# Patient Record
Sex: Male | Born: 1950
Health system: Southern US, Community
[De-identification: ages and names within clinical notes are randomized; demographics above are authoritative.]

## PROBLEM LIST (undated history)

## (undated) DIAGNOSIS — C801 Malignant (primary) neoplasm, unspecified: Secondary | ICD-10-CM

## (undated) DIAGNOSIS — G500 Trigeminal neuralgia: Secondary | ICD-10-CM

## (undated) DIAGNOSIS — E119 Type 2 diabetes mellitus without complications: Secondary | ICD-10-CM

## (undated) DIAGNOSIS — T1490XA Injury, unspecified, initial encounter: Secondary | ICD-10-CM

## (undated) DIAGNOSIS — M5126 Other intervertebral disc displacement, lumbar region: Secondary | ICD-10-CM

## (undated) DIAGNOSIS — R200 Anesthesia of skin: Secondary | ICD-10-CM

## (undated) DIAGNOSIS — I10 Essential (primary) hypertension: Secondary | ICD-10-CM

## (undated) DIAGNOSIS — E785 Hyperlipidemia, unspecified: Secondary | ICD-10-CM

## (undated) DIAGNOSIS — R202 Paresthesia of skin: Secondary | ICD-10-CM

## (undated) HISTORY — DX: Other intervertebral disc displacement, lumbar region: M51.26

## (undated) HISTORY — PX: TRIGEMINAL NERVE DECOMPRESSION: SHX2579

## (undated) HISTORY — DX: Essential (primary) hypertension: I10

## (undated) HISTORY — DX: Hyperlipidemia, unspecified: E78.5

## (undated) HISTORY — DX: Trigeminal neuralgia: G50.0

## (undated) HISTORY — DX: Type 2 diabetes mellitus without complications: E11.9

---

## 2003-10-22 ENCOUNTER — Ambulatory Visit (HOSPITAL_COMMUNITY): Admission: RE | Admit: 2003-10-22 | Discharge: 2003-10-22 | Payer: Self-pay | Admitting: Gastroenterology

## 2004-05-07 HISTORY — PX: KNEE ARTHROSCOPY: SUR90

## 2007-08-26 ENCOUNTER — Encounter: Admission: RE | Admit: 2007-08-26 | Discharge: 2007-08-26 | Payer: Self-pay | Admitting: Sports Medicine

## 2010-09-22 NOTE — Op Note (Signed)
NAMEORVILL, COULTHARD                          ACCOUNT NO.:  [id number]   MEDICAL RECORD NO.:  [id number]                   PATIENT TYPE:  AMB   LOCATION:  ENDO                                 FACILITY:  [facility]   PHYSICIAN:  Anselmo Rod, M.D.               DATE OF BIRTH:  04-18-1951   DATE OF PROCEDURE:  10/22/2003  DATE OF DISCHARGE:                                 OPERATIVE REPORT   PROCEDURE PERFORMED:  Screening colonoscopy.   ENDOSCOPIST:  Anselmo Rod, M.D.   INSTRUMENT USED:  Olympus video colonoscope.   INDICATION FOR PROCEDURE:  A 60 year old white male undergoing screening  colonoscopy.  Rule out colonic polyps, masses, etc.   PREPROCEDURE PREPARATION:  Informed consent was procured from the patient.  The patient was fasted for eight hours prior to the procedure and prepped  with a bottle of magnesium citrate and a gallon of GoLYTELY the night prior  to the procedure.   PREPROCEDURE PHYSICAL:  VITAL SIGNS:  The patient had stable vital signs.  NECK:  Supple.  CHEST:  Clear to auscultation.  S1, S2 regular.  ABDOMEN:  Soft with normal bowel sounds.   DESCRIPTION OF PROCEDURE:  The patient was placed in the left lateral  decubitus position and sedated with 60 mg of Demerol and 6 mg of Versed  intravenously in slow increments.  Once the patient was adequately sedate  and maintained on low-flow oxygen and continuous cardiac monitoring, the  Olympus video colonoscope was advanced from the rectum to the cecum.  Except  for small internal hemorrhoids, no other abnormalities were noted.  No  masses, polyps, erosions, ulcerations, or diverticula were seen.  The  patient tolerated the procedure well without immediate complications.   IMPRESSION:  Normal colonoscopy except for small internal hemorrhoids, no  masses, polyps, or diverticula seen.   RECOMMENDATIONS:  1. Continue a high-fiber diet.  2. Repeat colonoscopy in the next 10 years unless the patient develops  any     abnormal symptoms in the interim.  3. Outpatient follow-up as the need arises in the future.                                               Anselmo Rod, M.D.    JNM/MEDQ  D:  10/22/2003  T:  10/23/2003  Job:  [id number]   cc:   Marjory Lies, M.D.  [id number]  [place]  [place] [address]  Fax: [phone]

## 2011-01-27 ENCOUNTER — Emergency Department (HOSPITAL_COMMUNITY): Payer: 59

## 2011-01-27 ENCOUNTER — Inpatient Hospital Stay (HOSPITAL_COMMUNITY)
Admission: EM | Admit: 2011-01-27 | Discharge: 2011-01-28 | DRG: 505 | Disposition: A | Discharge status: Home / Self Care | Payer: 59 | Attending: Orthopedic Surgery | Admitting: Orthopedic Surgery

## 2011-01-27 DIAGNOSIS — S92919B Unspecified fracture of unspecified toe(s), initial encounter for open fracture: Principal | ICD-10-CM | POA: Diagnosis present

## 2011-01-27 DIAGNOSIS — W28XXXA Contact with powered lawn mower, initial encounter: Secondary | ICD-10-CM | POA: Diagnosis present

## 2011-01-27 DIAGNOSIS — M109 Gout, unspecified: Secondary | ICD-10-CM | POA: Diagnosis present

## 2011-01-27 DIAGNOSIS — I10 Essential (primary) hypertension: Secondary | ICD-10-CM | POA: Diagnosis present

## 2011-01-27 LAB — POCT I-STAT, CHEM 8
Calcium, Ion: 1.18 mmol/L (ref 1.12–1.32)
Chloride: 105 mEq/L (ref 96–112)
Glucose, Bld: 113 mg/dL — ABNORMAL HIGH (ref 70–99)
HCT: 43 % (ref 39.0–52.0)

## 2011-01-27 LAB — DIFFERENTIAL
Basophils Relative: 0 % (ref 0–1)
Eosinophils Absolute: 0.1 10*3/uL (ref 0.0–0.7)
Monocytes Absolute: 0.6 10*3/uL (ref 0.1–1.0)
Monocytes Relative: 7 % (ref 3–12)
Neutro Abs: 5.4 10*3/uL (ref 1.7–7.7)

## 2011-01-27 LAB — CBC
Hemoglobin: 13.8 g/dL (ref 13.0–17.0)
MCH: 30.2 pg (ref 26.0–34.0)
MCHC: 34.3 g/dL (ref 30.0–36.0)

## 2011-01-27 LAB — POCT I-STAT TROPONIN I: Troponin i, poc: 0.03 ng/mL (ref 0.00–0.08)

## 2011-01-28 LAB — BASIC METABOLIC PANEL
BUN: 15 mg/dL (ref 6–23)
Calcium: 8.5 mg/dL (ref 8.4–10.5)
Creatinine, Ser: 0.88 mg/dL (ref 0.50–1.35)
GFR calc Af Amer: >60 mL/min (ref >60)
GFR calc non Af Amer: >60 mL/min (ref >60)
Potassium: 3.8 mEq/L (ref 3.5–5.1)

## 2011-01-28 LAB — CBC
HCT: 37.2 % — ABNORMAL LOW (ref 39.0–52.0)
MCH: 30 pg (ref 26.0–34.0)
MCHC: 33.9 g/dL (ref 30.0–36.0)
MCV: 88.6 fL (ref 78.0–100.0)
RDW: 13.1 % (ref 11.5–15.5)

## 2011-02-11 NOTE — Op Note (Signed)
  NAMEJEREK, Jerry Hudson NO.:  [id number]  MEDICAL RECORD NO.:  [id number]  LOCATION:  1604                         FACILITY:  [facility]  PHYSICIAN:  Mila Homer. Sherlean Foot, M.D. DATE OF BIRTH:  [birth year]  DATE OF PROCEDURE: DATE OF DISCHARGE:                              OPERATIVE REPORT   SURGEON:  Mila Homer. Sherlean Foot, M.D.  ASSISTANT:  Altamese Cabal, PA-C  ANESTHESIA:  General.  PREOPERATIVE DIAGNOSES:  Right elbow fracture and laceration of a right great toe.  POSTOPERATIVE DIAGNOSES:  Right elbow fracture and laceration of a right great toe.  PROCEDURE:  Right elbow irrigation and debridement and percutaneous fixation of the right great toe.  INDICATIONS FOR PROCEDURE:  The patient is a 60 year old status post long arm injury today for open fracture of the distal phalanx.  Consent obtained for irrigation and debridement and fracture fixation.  DESCRIPTION OF PROCEDURE:  The patient was laid supine, administered general anesthesia.  The right toe was prepped and draped in usual sterile fashion.  I then irrigated with 3000 mL of LR, through the post lavage system.  There was really no over debris around the toe.  The distal phalanx of fractured in a horizontal plane which is rather bizarre.  He has is very fungally infected toe nail.  I cleaned this as well as total debris monitored around the edges of the skin and then started reducing the fracture anatomically checking under AP and lateral C-arm images.  I then closed the incision with 2-0 nylon sutures in a horizontal mattress fashion.  I then dressed with Xeroform dressing sponges, full back pullers between the toes and dressed with Kerlix dressing with the point.  Wrapped on top of that with a 4-inch Coban on top of that, again the tourniquet was used.  My concern with this is that I think additional arteries on both sides of the toe probably compromised while there was fairly reasonable 2-point capillary  refill. Again the procedure was slow and was then possible to capillary refill in nail bed due to the fugal infected nail.  I almost removed that nail; however, I felt it may cause him much of a dorsal flap, so that I could keep there.  The patient was taken to recovery room in stable condition.          ______________________________ Mila Homer Sherlean Foot, M.D.     SDL/MEDQ  D:  01/27/2011  T:  01/28/2011  Job:  [id number]  Electronically Signed by Georgena Spurling M.D. on 02/11/2011 11:22:05 AM

## 2011-03-06 NOTE — Discharge Summary (Signed)
  NAMEJAHZIER, VILLALON NO.:  [id number]  MEDICAL RECORD NO.:  [id number]  LOCATION:  1604                         FACILITY:  [facility]  PHYSICIAN:  Mila Homer. Sherlean Foot, M.D. DATE OF BIRTH:  01/22/1951  DATE OF ADMISSION:  01/27/2011 DATE OF DISCHARGE:  01/29/2011                              DISCHARGE SUMMARY   ADMISSION DIAGNOSIS:  Right elbow fracture and laceration of the right great toe.  DISCHARGE DIAGNOSIS:  Right elbow fracture and laceration of the right great toe.  PROCEDURE:  A right elbow irrigation and debridement, percutaneous fixation of the right great toe.  HISTORY:  The patient is a 60 year old, status post long arm injury today for open fracture of the distal phalanx.  Consent obtained, irrigated and debrided fracture fixation.  This patient has no known drug allergies.  Upon admission, the patient was taking allopurinol 4 cap daily.  HOSPITAL COURSE:  This is a 60 year old male, comes in for irrigation and debridement of the right great toe status post irrigation and fixation.  The patient was doing well, was pain controlled, vital signs were stable.  Blood levels were stable as well.  Electrolytes were intact.  The wound was dressed and patient was neurovascularly intact, had good sensation and total fixation pin was in place.  The patient was discharged and was continued to take antibiotics at home.  Discharge home today and  will follow up with [place] on February 01, 2011, with Dr. Sherlean Foot.  The patient was given 2 doses of IV Ancef upon leaving hospital, on postop day 2.  The patient will follow with Dr. Sherlean Foot on  February 01, 2011, call for appointment,  [phone], discharged in improved condition.    ______________________________ Altamese Cabal, PA-C   ______________________________ Mila Homer. Sherlean Foot, M.D.    MJ/MEDQ  D:  02/28/2011  T:  02/28/2011  Job:  [id number]  Electronically Signed by Georgena Spurling M.D. on 03/06/2011 05:26:26  PM

## 2013-02-18 ENCOUNTER — Other Ambulatory Visit: Payer: Self-pay | Admitting: Family Medicine

## 2013-02-18 DIAGNOSIS — H538 Other visual disturbances: Secondary | ICD-10-CM

## 2013-02-18 DIAGNOSIS — G510 Bell's palsy: Secondary | ICD-10-CM

## 2013-02-18 DIAGNOSIS — R519 Headache, unspecified: Secondary | ICD-10-CM

## 2013-02-19 ENCOUNTER — Ambulatory Visit
Admission: RE | Admit: 2013-02-19 | Discharge: 2013-02-19 | Disposition: A | Discharge status: Home / Self Care | Payer: 59 | Source: Ambulatory Visit | Attending: Family Medicine | Admitting: Family Medicine

## 2013-02-19 DIAGNOSIS — G510 Bell's palsy: Secondary | ICD-10-CM

## 2013-02-19 DIAGNOSIS — H538 Other visual disturbances: Secondary | ICD-10-CM

## 2013-02-19 DIAGNOSIS — R519 Headache, unspecified: Secondary | ICD-10-CM

## 2013-03-16 ENCOUNTER — Ambulatory Visit (INDEPENDENT_AMBULATORY_CARE_PROVIDER_SITE_OTHER): Payer: 59 | Admitting: Neurology

## 2013-03-16 ENCOUNTER — Encounter: Payer: Self-pay | Admitting: Neurology

## 2013-03-16 VITALS — BP 126/84 | HR 78 | Temp 98.0°F | Ht 73.0 in | Wt 269.8 lb

## 2013-03-16 DIAGNOSIS — G500 Trigeminal neuralgia: Secondary | ICD-10-CM

## 2013-03-16 NOTE — Progress Notes (Signed)
[facility] Clinic Note - Initial Visit   Date: 03/16/2013    Jerry Hudson MRN: [id number] DOB: 05/11/1950   Dear Dr Arlyce Dice:  Thank you for your kind referral of Jerry Hudson for consultation of facial pain. Although his history is well known to you, please allow [place] to reiterate it for the purpose of our medical record. The patient was accompanied to the clinic by self.   History of Present Illness: Jerry Hudson is a 62 y.o. year-old right-handed Caucasian male with history of hypertension presenting for evaluation of facial pain.   In September 2014, he developed right sided facial pain over the right cheek radiation into the nose and eye. Pain was described as a constant tingling.  No associated numbness or weakness.  Symptoms were not exacerbated by chewing, talking, or cold air. Light pressure over the right cheek would occasionally worsen his pain.  He saw his eye doctor who did not find any abnormalities.  He then saw his PCP, Dr. Arlyce Dice, in September 2014, who started him on prednisone.  About one week later, symptoms persisted so he had a MRI brain (see below).  Over past week, his symptoms have completely resolved.  Out-side paper records, electronic medical record, and images have been reviewed where available and summarized as:  MRI brain wo contrast 02/19/2013: Scattered small white matter hyperintensities bilaterally. These have a chronic appearance and may be due to chronic microvascular ischemia or migraine headache.  No acute abnormality.    Past Medical History  Diagnosis Date  . Hypertension     Past Surgical History  Procedure Laterality Date  . Knee arthroscopy  2006    both knees     Medications:  Amlodipine 5mg  daily Aspirin 325 mg daily  Allergies: No Known Allergies  Family History: Family History  Problem Relation Age of Onset  . Cancer Mother   . Cancer Father   . Diabetes Sister     Social History: History    Social History  . Marital Status: Married    Spouse Name: N/A    Number of Children: N/A  . Years of Education: N/A   Occupational History  . Not on file.   Social History Main Topics  . Smoking status: Never Smoker   . Smokeless tobacco: Never Used  . Alcohol Use: Yes  . Drug Use: No  . Sexual Activity: Not on file   Other Topics Concern  . Not on file   Social History Narrative  . No narrative on file    Review of Systems:  CONSTITUTIONAL: No fevers, chills, night sweats, or weight loss.   EYES: No visual changes or eye pain ENT: No hearing changes.  No history of nose bleeds.   RESPIRATORY: No cough, wheezing and shortness of breath.   CARDIOVASCULAR: Negative for chest pain, and palpitations.   GI: Negative for abdominal discomfort, blood in stools or black stools.  No recent change in bowel habits.   GU:  No history of incontinence.   MUSCLOSKELETAL: No history of joint pain or swelling.  No myalgias.   SKIN: Negative for lesions, rash, and itching.   HEMATOLOGY/ONCOLOGY: Negative for prolonged bleeding, bruising easily, and swollen nodes.  No history of cancer.   ENDOCRINE: Negative for cold or heat intolerance, polydipsia or goiter.   PSYCH:  No depression or anxiety symptoms.   NEURO: As Above.   Vital Signs:  BP 126/84  Pulse 78  Temp(Src) 98 F (36.7 C) (  Oral)  Ht 6\' 1"  (1.854 m)  Wt 269 lb 12.8 oz (122.38 kg)  BMI 35.60 kg/m2   Neurological Exam: MENTAL STATUS including orientation to time, place, person, recent and remote memory, attention span and concentration, language, and fund of knowledge is normal.  Speech is not dysarthric.  CRANIAL NERVES: II:  No visual field defects.  Unremarkable fundi.   III-IV-VI: Pupils equal round and reactive to light.  Normal conjugate, extra-ocular eye movements in all directions of gaze.  No nystagmus.  No ptosis.   V:  Normal facial sensation.     VII:  Normal facial symmetry and movements.  VIII:  Normal  hearing and vestibular function.   IX-X:  Normal palatal movement.   XI:  Normal shoulder shrug and head rotation.   XII:  Normal tongue strength and range of motion, no deviation or fasciculation.  MOTOR:  Motor strength is 5/5 in all extremities.  No atrophy, fasciculations or abnormal movements.  No pronator drift.  Tone is normal.    MSRs:  Right                                                                 Left brachioradialis 2+  brachioradialis 2+  biceps 2+  biceps 2+  triceps 2+  triceps 2+  patellar 2+  patellar 2+  ankle jerk 1+  ankle jerk 1+  Hoffman no  Hoffman no  plantar response down  plantar response down   SENSORY:  Reduced vibration at great toe bilaterally (age-related loss), otherwise normal and symmetric perception of light touch, pinprick, and proprioception.  Romberg's sign absent.   COORDINATION/GAIT: Normal finger-to- nose-finger and heel-to-shin.  Intact rapid alternating movements bilaterally.  Able to rise from a chair without using arms.  Gait narrow based and stable. Tandem and stressed gait intact.    IMPRESSION/PLAN: Mr. Klingel is a 62 year-old gentleman presenting for evaluation of right facial pain, which over the past week has resolved.  Neurological examination is normal and non-focal.  Based on his history, he most likely had trigeminal neuralgia. No history of shingles or vesicular eruption over the face.  I have reviewed his MRI brain with the patient which shows minimal nonspecific white matter changes and is otherwise unremarkable.    Since his symptoms are resolved, exam is normal, and he has no other neurological complaints, no intervention is indicated.  He can return to clinic as needed, should he develop any new neurological symptoms.   The duration of this appointment visit was 40 minutes of face-to-face time with the patient.  Greater than 50% of this time was spent in counseling, explanation of diagnosis, planning of further management,  and coordination of care.   Thank you for allowing me to participate in patient's care.  If I can answer any additional questions, I would be pleased to do so.    Sincerely,    Timmey Lamba K. Allena Katz, DO

## 2014-03-23 ENCOUNTER — Other Ambulatory Visit (HOSPITAL_COMMUNITY): Payer: Self-pay | Admitting: Family Medicine

## 2014-03-23 ENCOUNTER — Ambulatory Visit (HOSPITAL_COMMUNITY)
Admission: RE | Admit: 2014-03-23 | Discharge: 2014-03-23 | Disposition: A | Discharge status: Home / Self Care | Payer: 59 | Source: Ambulatory Visit | Attending: Physician Assistant | Admitting: Physician Assistant

## 2014-03-23 DIAGNOSIS — M79604 Pain in right leg: Secondary | ICD-10-CM

## 2014-03-23 DIAGNOSIS — M7989 Other specified soft tissue disorders: Secondary | ICD-10-CM

## 2014-03-23 DIAGNOSIS — I868 Varicose veins of other specified sites: Secondary | ICD-10-CM | POA: Diagnosis not present

## 2014-03-23 DIAGNOSIS — M79601 Pain in right arm: Secondary | ICD-10-CM | POA: Diagnosis present

## 2014-03-23 DIAGNOSIS — M79609 Pain in unspecified limb: Secondary | ICD-10-CM

## 2014-03-23 DIAGNOSIS — R609 Edema, unspecified: Secondary | ICD-10-CM

## 2014-03-23 NOTE — Progress Notes (Addendum)
VASCULAR LAB PRELIMINARY  PRELIMINARY  PRELIMINARY  PRELIMINARY  Right lower extremity venous duplex completed.    Preliminary report:  Right:  No evidence of DVT, superficial thrombosis, or Baker's cyst. Multiple dilated varicose veins noted in the calf.  Uday Jantz, RVS 03/23/2014, 11:31 AM

## 2014-04-22 ENCOUNTER — Other Ambulatory Visit: Payer: Self-pay | Admitting: Orthopedic Surgery

## 2014-04-22 DIAGNOSIS — M5410 Radiculopathy, site unspecified: Secondary | ICD-10-CM

## 2014-04-26 ENCOUNTER — Other Ambulatory Visit: Payer: Self-pay | Admitting: Orthopedic Surgery

## 2014-04-26 ENCOUNTER — Ambulatory Visit
Admission: RE | Admit: 2014-04-26 | Discharge: 2014-04-26 | Disposition: A | Discharge status: Home / Self Care | Payer: 59 | Source: Ambulatory Visit | Attending: Orthopedic Surgery | Admitting: Orthopedic Surgery

## 2014-04-26 DIAGNOSIS — M5410 Radiculopathy, site unspecified: Secondary | ICD-10-CM

## 2014-04-26 MED ORDER — IOHEXOL 180 MG/ML  SOLN
1.0000 mL | Freq: Once | INTRAMUSCULAR | Status: AC | PRN
Start: 2014-04-26 — End: 2014-04-26
  Administered 2014-04-26: 1 mL via EPIDURAL

## 2014-04-26 MED ORDER — METHYLPREDNISOLONE ACETATE 40 MG/ML INJ SUSP (RADIOLOG
120.0000 mg | Freq: Once | INTRAMUSCULAR | Status: AC
  Administered 2014-04-26: 120 mg via EPIDURAL

## 2014-04-26 NOTE — Discharge Instructions (Signed)

## 2015-03-07 ENCOUNTER — Other Ambulatory Visit: Payer: Self-pay | Admitting: Urology

## 2015-04-15 ENCOUNTER — Encounter (HOSPITAL_COMMUNITY)
Admission: RE | Admit: 2015-04-15 | Discharge: 2015-04-15 | Disposition: A | Discharge status: Home / Self Care | Payer: Commercial Managed Care - HMO | Source: Ambulatory Visit | Attending: Urology | Admitting: Urology

## 2015-04-15 ENCOUNTER — Encounter (HOSPITAL_COMMUNITY): Payer: Self-pay

## 2015-04-15 DIAGNOSIS — Z01812 Encounter for preprocedural laboratory examination: Secondary | ICD-10-CM | POA: Insufficient documentation

## 2015-04-15 DIAGNOSIS — Z01810 Encounter for preprocedural cardiovascular examination: Secondary | ICD-10-CM | POA: Insufficient documentation

## 2015-04-15 HISTORY — DX: Malignant (primary) neoplasm, unspecified: C80.1

## 2015-04-15 HISTORY — DX: Paresthesia of skin: R20.2

## 2015-04-15 HISTORY — DX: Paresthesia of skin: R20.0

## 2015-04-15 HISTORY — DX: Anesthesia of skin: R20.0

## 2015-04-15 HISTORY — DX: Injury, unspecified, initial encounter: T14.90XA

## 2015-04-15 LAB — CBC
HEMATOCRIT: 43 % (ref 39.0–52.0)
HEMOGLOBIN: 14.2 g/dL (ref 13.0–17.0)
MCH: 30.9 pg (ref 26.0–34.0)
MCHC: 33 g/dL (ref 30.0–36.0)
MCV: 93.7 fL (ref 78.0–100.0)
Platelets: 222 10*3/uL (ref 150–400)
RBC: 4.59 MIL/uL (ref 4.22–5.81)
RDW: 13.6 % (ref 11.5–15.5)
WBC: 6.8 10*3/uL (ref 4.0–10.5)

## 2015-04-15 LAB — BASIC METABOLIC PANEL
ANION GAP: 8 (ref 5–15)
BUN: 16 mg/dL (ref 6–20)
CO2: 28 mmol/L (ref 22–32)
Calcium: 9.7 mg/dL (ref 8.9–10.3)
Chloride: 105 mmol/L (ref 101–111)
Creatinine, Ser: 0.94 mg/dL (ref 0.61–1.24)
GLUCOSE: 121 mg/dL — AB (ref 65–99)
POTASSIUM: 4.5 mmol/L (ref 3.5–5.1)
SODIUM: 141 mmol/L (ref 135–145)

## 2015-04-15 LAB — ABO/RH: ABO/RH(D): A POS

## 2015-04-15 NOTE — Patient Instructions (Signed)
Jerry Hudson  04/15/2015   Your procedure is scheduled on: Wednesday April 20, 2015   Report to [facility]  Entrance take [place]  elevators to 3rd floor to  [place] at 6:30 AM.  Call this number if you have problems the morning of surgery [phone]   Remember: ONLY 1 PERSON MAY GO WITH YOU TO SHORT STAY TO GET  READY MORNING OF [place].  Do not eat food or drink liquids :After Midnight.     Take these medicines the morning of surgery with A SIP OF WATER: Allopurinol                STOP ASPIRIN 5 DAYS PRIOR TO SURGERY PER SURGEON INSTRUCTION                                You may not have any metal on your body including hair pins and              piercings  Do not wear jewelry, lotions, powders or colognes , deodorant                          Men may shave face and neck.   Do not bring valuables to the hospital. [place].  Contacts, dentures or bridgework may not be worn into surgery.  Leave suitcase in the car. After surgery it may be brought to your room.     Special Instructions: FOLLOW MD INSTRUCTION IN REGARDS TO BOWEL PREPARATION PRIOR TO SUGERY DATE               _____________________________________________________________________             [facility] - Preparing for Surgery Before surgery, you can play an important role.  Because skin is not sterile, your skin needs to be as free of germs as possible.  You can reduce the number of germs on your skin by washing with CHG (chlorahexidine gluconate) soap before surgery.  CHG is an antiseptic cleaner which kills germs and bonds with the skin to continue killing germs even after washing. Please DO NOT use if you have an allergy to CHG or antibacterial soaps.  If your skin becomes reddened/irritated stop using the CHG and inform your nurse when you arrive at Short Stay. Do not shave (including legs and underarms) for at least  48 hours prior to the first CHG shower.  You may shave your face/neck. Please follow these instructions carefully:  1.  Shower with CHG Soap the night before surgery and the  morning of Surgery.  2.  If you choose to wash your hair, wash your hair first as usual with your  normal  shampoo.  3.  After you shampoo, rinse your hair and body thoroughly to remove the  shampoo.                           4.  Use CHG as you would any other liquid soap.  You can apply chg directly  to the skin and wash  Gently with a scrungie or clean washcloth.  5.  Apply the CHG Soap to your body ONLY FROM THE NECK DOWN.   Do not use on face/ open                           Wound or open sores. Avoid contact with eyes, ears mouth and genitals (private parts).                       Wash face,  Genitals (private parts) with your normal soap.             6.  Wash thoroughly, paying special attention to the area where your surgery  will be performed.  7.  Thoroughly rinse your body with warm water from the neck down.  8.  DO NOT shower/wash with your normal soap after using and rinsing off  the CHG Soap.                9.  Pat yourself dry with a clean towel.            10.  Wear clean pajamas.            11.  Place clean sheets on your bed the night of your first shower and do not  sleep with pets. Day of Surgery : Do not apply any lotions/deodorants the morning of surgery.  Please wear clean clothes to the hospital/surgery center.  FAILURE TO FOLLOW THESE INSTRUCTIONS MAY RESULT IN THE CANCELLATION OF YOUR SURGERY PATIENT SIGNATURE_________________________________  NURSE SIGNATURE__________________________________  ________________________________________________________________________

## 2015-04-15 NOTE — Progress Notes (Signed)
Your patient has screened at an elevated risk for Obstructive Sleep Apnea using the Stop-Bang Tool during a pre-surgical visit. Patient scored at high risk.  

## 2015-04-19 MED ORDER — DEXTROSE 5 % IV SOLN
3.0000 g | INTRAVENOUS | Status: AC
  Administered 2015-04-20: 3 g via INTRAVENOUS
  Filled 2015-04-19: qty 3000

## 2015-04-19 NOTE — Anesthesia Preprocedure Evaluation (Addendum)
Anesthesia Evaluation  Patient identified by MRN, date of birth, ID band Patient awake    Reviewed: Allergy & Precautions, H&P , NPO status , Patient's Chart, lab work & pertinent test results  Airway Mallampati: II  TM Distance: >3 FB Neck ROM: full    Dental  (+) Dental Advisory Given, Chipped One upper front tooth chipped:   Pulmonary neg pulmonary ROS,    Pulmonary exam normal breath sounds clear to auscultation       Cardiovascular Exercise Tolerance: Good hypertension, Pt. on medications Normal cardiovascular exam Rhythm:regular Rate:Normal     Neuro/Psych Numbness and tingling in feet negative neurological ROS  negative psych ROS   GI/Hepatic negative GI ROS, Neg liver ROS,   Endo/Other  negative endocrine ROS  Renal/GU negative Renal ROS  negative genitourinary   Musculoskeletal   Abdominal   Peds  Hematology negative hematology ROS (+)   Anesthesia Other Findings   Reproductive/Obstetrics negative OB ROS                            Anesthesia Physical Anesthesia Plan  ASA: II  Anesthesia Plan: General   Post-op Pain Management:    Induction: Intravenous  Airway Management Planned: Oral ETT  Additional Equipment:   Intra-op Plan:   Post-operative Plan: Extubation in OR  Informed Consent: I have reviewed the patients History and Physical, chart, labs and discussed the procedure including the risks, benefits and alternatives for the proposed anesthesia with the patient or authorized representative who has indicated his/her understanding and acceptance.   Dental Advisory Given  Plan Discussed with: CRNA and Surgeon  Anesthesia Plan Comments:         Anesthesia Quick Evaluation

## 2015-04-20 ENCOUNTER — Inpatient Hospital Stay (HOSPITAL_COMMUNITY): Payer: Commercial Managed Care - HMO | Admitting: Anesthesiology

## 2015-04-20 ENCOUNTER — Encounter (HOSPITAL_COMMUNITY)
Admission: RE | Disposition: A | Discharge status: Home / Self Care | Payer: Self-pay | Source: Ambulatory Visit | Attending: Urology

## 2015-04-20 ENCOUNTER — Inpatient Hospital Stay (HOSPITAL_COMMUNITY)
Admission: RE | Admit: 2015-04-20 | Discharge: 2015-04-21 | DRG: 708 | Disposition: A | Discharge status: Home / Self Care | Payer: Commercial Managed Care - HMO | Source: Ambulatory Visit | Attending: Urology | Admitting: Urology

## 2015-04-20 ENCOUNTER — Encounter (HOSPITAL_COMMUNITY): Payer: Self-pay | Admitting: Certified Registered"

## 2015-04-20 DIAGNOSIS — C61 Malignant neoplasm of prostate: Principal | ICD-10-CM | POA: Diagnosis present

## 2015-04-20 DIAGNOSIS — I10 Essential (primary) hypertension: Secondary | ICD-10-CM | POA: Diagnosis present

## 2015-04-20 DIAGNOSIS — Z01812 Encounter for preprocedural laboratory examination: Secondary | ICD-10-CM

## 2015-04-20 DIAGNOSIS — Z7982 Long term (current) use of aspirin: Secondary | ICD-10-CM | POA: Diagnosis not present

## 2015-04-20 DIAGNOSIS — Z833 Family history of diabetes mellitus: Secondary | ICD-10-CM

## 2015-04-20 DIAGNOSIS — Z809 Family history of malignant neoplasm, unspecified: Secondary | ICD-10-CM | POA: Diagnosis not present

## 2015-04-20 HISTORY — PX: ROBOT ASSISTED LAPAROSCOPIC RADICAL PROSTATECTOMY: SHX5141

## 2015-04-20 HISTORY — PX: LYMPHADENECTOMY: SHX5960

## 2015-04-20 LAB — TYPE AND SCREEN
ABO/RH(D): A POS
ANTIBODY SCREEN: NEGATIVE

## 2015-04-20 LAB — HEMOGLOBIN AND HEMATOCRIT, BLOOD
HEMATOCRIT: 39.1 % (ref 39.0–52.0)
HEMOGLOBIN: 12.9 g/dL — AB (ref 13.0–17.0)

## 2015-04-20 SURGERY — ROBOTIC ASSISTED LAPAROSCOPIC RADICAL PROSTATECTOMY LEVEL 2
Anesthesia: General

## 2015-04-20 MED ORDER — DEXTROSE-NACL 5-0.45 % IV SOLN
INTRAVENOUS | Status: DC
  Administered 2015-04-20 – 2015-04-21 (×3): via INTRAVENOUS

## 2015-04-20 MED ORDER — PROPOFOL 10 MG/ML IV BOLUS
INTRAVENOUS | Status: DC | PRN
  Administered 2015-04-20: 150 mg via INTRAVENOUS
  Administered 2015-04-20: 50 mg via INTRAVENOUS

## 2015-04-20 MED ORDER — LIDOCAINE HCL (PF) 2 % IJ SOLN
INTRAMUSCULAR | Status: DC | PRN
  Administered 2015-04-20: 30 mg via INTRADERMAL

## 2015-04-20 MED ORDER — ALLOPURINOL 300 MG PO TABS
300.0000 mg | ORAL_TABLET | Freq: Every day | ORAL | Status: DC
  Administered 2015-04-21: 300 mg via ORAL
  Filled 2015-04-20: qty 1

## 2015-04-20 MED ORDER — ONDANSETRON HCL 4 MG/2ML IJ SOLN
INTRAMUSCULAR | Status: AC
  Filled 2015-04-20: qty 2

## 2015-04-20 MED ORDER — SUGAMMADEX SODIUM 200 MG/2ML IV SOLN
INTRAVENOUS | Status: AC
  Filled 2015-04-20: qty 2

## 2015-04-20 MED ORDER — SUCCINYLCHOLINE CHLORIDE 20 MG/ML IJ SOLN
INTRAMUSCULAR | Status: DC | PRN
  Administered 2015-04-20: 140 mg via INTRAVENOUS

## 2015-04-20 MED ORDER — FENTANYL CITRATE (PF) 100 MCG/2ML IJ SOLN
INTRAMUSCULAR | Status: DC | PRN
  Administered 2015-04-20: 50 ug via INTRAVENOUS
  Administered 2015-04-20: 100 ug via INTRAVENOUS
  Administered 2015-04-20: 50 ug via INTRAVENOUS

## 2015-04-20 MED ORDER — SODIUM CHLORIDE 0.9 % IJ SOLN
INTRAMUSCULAR | Status: DC | PRN
  Administered 2015-04-20: 40 mL
  Administered 2015-04-20: 20 mL

## 2015-04-20 MED ORDER — LABETALOL HCL 5 MG/ML IV SOLN
INTRAVENOUS | Status: DC | PRN
  Administered 2015-04-20 (×2): 5 mg via INTRAVENOUS

## 2015-04-20 MED ORDER — SULFAMETHOXAZOLE-TRIMETHOPRIM 800-160 MG PO TABS: 1.0000 | ORAL_TABLET | Freq: Two times a day (BID) | ORAL | Status: DC

## 2015-04-20 MED ORDER — STERILE WATER FOR IRRIGATION IR SOLN
Status: DC | PRN
  Administered 2015-04-20: 1000 mL

## 2015-04-20 MED ORDER — BUPIVACAINE LIPOSOME 1.3 % IJ SUSP
20.0000 mL | Freq: Once | INTRAMUSCULAR | Status: AC
  Administered 2015-04-20: 20 mL
  Filled 2015-04-20: qty 20

## 2015-04-20 MED ORDER — LACTATED RINGERS IV SOLN
INTRAVENOUS | Status: DC | PRN
  Administered 2015-04-20 (×2): via INTRAVENOUS

## 2015-04-20 MED ORDER — HYDROCODONE-ACETAMINOPHEN 5-325 MG PO TABS: 1.0000 | ORAL_TABLET | Freq: Four times a day (QID) | ORAL | Status: DC | PRN

## 2015-04-20 MED ORDER — ROCURONIUM BROMIDE 100 MG/10ML IV SOLN
INTRAVENOUS | Status: AC
  Filled 2015-04-20: qty 1

## 2015-04-20 MED ORDER — DEXAMETHASONE SODIUM PHOSPHATE 10 MG/ML IJ SOLN
INTRAMUSCULAR | Status: AC
  Filled 2015-04-20: qty 1

## 2015-04-20 MED ORDER — LABETALOL HCL 5 MG/ML IV SOLN
INTRAVENOUS | Status: AC
  Filled 2015-04-20: qty 4

## 2015-04-20 MED ORDER — HYDROMORPHONE HCL 1 MG/ML IJ SOLN
0.5000 mg | INTRAMUSCULAR | Status: DC | PRN
  Administered 2015-04-20: 1 mg via INTRAVENOUS
  Filled 2015-04-20: qty 1

## 2015-04-20 MED ORDER — ROCURONIUM BROMIDE 100 MG/10ML IV SOLN
INTRAVENOUS | Status: AC
Start: 2015-04-20 — End: 2015-04-20
  Filled 2015-04-20: qty 1

## 2015-04-20 MED ORDER — IRBESARTAN 150 MG PO TABS
300.0000 mg | ORAL_TABLET | Freq: Every day | ORAL | Status: DC
  Administered 2015-04-20 – 2015-04-21 (×2): 300 mg via ORAL
  Filled 2015-04-20 (×2): qty 2

## 2015-04-20 MED ORDER — DIPHENHYDRAMINE HCL 50 MG/ML IJ SOLN: 12.5000 mg | Freq: Four times a day (QID) | INTRAMUSCULAR | Status: DC | PRN

## 2015-04-20 MED ORDER — LACTATED RINGERS IV SOLN: INTRAVENOUS | Status: DC

## 2015-04-20 MED ORDER — SUGAMMADEX SODIUM 200 MG/2ML IV SOLN
INTRAVENOUS | Status: DC | PRN
  Administered 2015-04-20: 200 mg via INTRAVENOUS

## 2015-04-20 MED ORDER — SODIUM CHLORIDE 0.9 % IJ SOLN
INTRAMUSCULAR | Status: AC
  Filled 2015-04-20: qty 20

## 2015-04-20 MED ORDER — CEFAZOLIN SODIUM 1-5 GM-% IV SOLN
INTRAVENOUS | Status: AC
  Filled 2015-04-20: qty 150

## 2015-04-20 MED ORDER — PROPOFOL 10 MG/ML IV BOLUS
INTRAVENOUS | Status: AC
  Filled 2015-04-20: qty 20

## 2015-04-20 MED ORDER — ONDANSETRON HCL 4 MG/2ML IJ SOLN: 4.0000 mg | INTRAMUSCULAR | Status: DC | PRN

## 2015-04-20 MED ORDER — HYDROMORPHONE HCL 1 MG/ML IJ SOLN: 0.2500 mg | INTRAMUSCULAR | Status: DC | PRN

## 2015-04-20 MED ORDER — LIDOCAINE HCL (CARDIAC) 20 MG/ML IV SOLN
INTRAVENOUS | Status: AC
  Filled 2015-04-20: qty 5

## 2015-04-20 MED ORDER — HYDROMORPHONE HCL 2 MG/ML IJ SOLN
INTRAMUSCULAR | Status: AC
  Filled 2015-04-20: qty 1

## 2015-04-20 MED ORDER — SODIUM CHLORIDE 0.9 % IV BOLUS (SEPSIS)
1000.0000 mL | Freq: Once | INTRAVENOUS | Status: AC
  Administered 2015-04-20: 1000 mL via INTRAVENOUS

## 2015-04-20 MED ORDER — FENTANYL CITRATE (PF) 250 MCG/5ML IJ SOLN
INTRAMUSCULAR | Status: AC
  Filled 2015-04-20: qty 5

## 2015-04-20 MED ORDER — OXYCODONE HCL 5 MG PO TABS
5.0000 mg | ORAL_TABLET | ORAL | Status: DC | PRN
  Administered 2015-04-21: 5 mg via ORAL
  Filled 2015-04-20: qty 1

## 2015-04-20 MED ORDER — OLMESARTAN MEDOXOMIL-HCTZ 40-12.5 MG PO TABS: 1.0000 | ORAL_TABLET | Freq: Every day | ORAL | Status: DC

## 2015-04-20 MED ORDER — DIPHENHYDRAMINE HCL 12.5 MG/5ML PO ELIX: 12.5000 mg | ORAL_SOLUTION | Freq: Four times a day (QID) | ORAL | Status: DC | PRN

## 2015-04-20 MED ORDER — LACTATED RINGERS IR SOLN
Status: DC | PRN
  Administered 2015-04-20: 1000 mL

## 2015-04-20 MED ORDER — HYDROCHLOROTHIAZIDE 12.5 MG PO CAPS
12.5000 mg | ORAL_CAPSULE | Freq: Every day | ORAL | Status: DC
  Administered 2015-04-20 – 2015-04-21 (×2): 12.5 mg via ORAL
  Filled 2015-04-20 (×2): qty 1

## 2015-04-20 MED ORDER — ACETAMINOPHEN 500 MG PO TABS
1000.0000 mg | ORAL_TABLET | Freq: Four times a day (QID) | ORAL | Status: AC
  Administered 2015-04-20 – 2015-04-21 (×3): 1000 mg via ORAL
  Filled 2015-04-20 (×3): qty 2

## 2015-04-20 MED ORDER — ONDANSETRON HCL 4 MG/2ML IJ SOLN
INTRAMUSCULAR | Status: DC | PRN
Start: 2015-04-20 — End: 2015-04-20
  Administered 2015-04-20: 4 mg via INTRAVENOUS

## 2015-04-20 MED ORDER — MIDAZOLAM HCL 5 MG/5ML IJ SOLN
INTRAMUSCULAR | Status: DC | PRN
  Administered 2015-04-20: 2 mg via INTRAVENOUS

## 2015-04-20 MED ORDER — MIDAZOLAM HCL 2 MG/2ML IJ SOLN
INTRAMUSCULAR | Status: AC
  Filled 2015-04-20: qty 2

## 2015-04-20 MED ORDER — HYDROMORPHONE HCL 1 MG/ML IJ SOLN
INTRAMUSCULAR | Status: DC | PRN
  Administered 2015-04-20: 1 mg via INTRAVENOUS

## 2015-04-20 MED ORDER — ROCURONIUM BROMIDE 100 MG/10ML IV SOLN
INTRAVENOUS | Status: DC | PRN
  Administered 2015-04-20: 20 mg via INTRAVENOUS
  Administered 2015-04-20: 50 mg via INTRAVENOUS
  Administered 2015-04-20 (×2): 10 mg via INTRAVENOUS

## 2015-04-20 MED ORDER — DEXAMETHASONE SODIUM PHOSPHATE 10 MG/ML IJ SOLN
INTRAMUSCULAR | Status: DC | PRN
  Administered 2015-04-20: 10 mg via INTRAVENOUS

## 2015-04-20 SURGICAL SUPPLY — 56 items
APPLICATOR COTTON TIP 6IN STRL (MISCELLANEOUS) ×6 IMPLANT
CABLE HIGH FREQUENCY MONO STRZ (ELECTRODE) ×3 IMPLANT
CATH FOLEY 2WAY SLVR 18FR 30CC (CATHETERS) ×3 IMPLANT
CATH ROBINSON RED A/P 16FR (CATHETERS) ×3 IMPLANT
CATH TIEMANN FOLEY 18FR 5CC (CATHETERS) ×3 IMPLANT
CHLORAPREP W/TINT 26ML (MISCELLANEOUS) ×3 IMPLANT
CLIP LIGATING HEM O LOK PURPLE (MISCELLANEOUS) ×18 IMPLANT
CLOTH BEACON ORANGE TIMEOUT ST (SAFETY) ×3 IMPLANT
COVER MAYO STAND STRL (DRAPES) IMPLANT
COVER SURGICAL LIGHT HANDLE (MISCELLANEOUS) ×3 IMPLANT
COVER TIP SHEARS 8 DVNC (MISCELLANEOUS) ×4 IMPLANT
COVER TIP SHEARS 8MM DA VINCI (MISCELLANEOUS) ×2
CUTTER ECHEON FLEX ENDO 45 340 (ENDOMECHANICALS) ×3 IMPLANT
DECANTER SPIKE VIAL GLASS SM (MISCELLANEOUS) ×3 IMPLANT
DRAPE ARM DVNC X/XI (DISPOSABLE) ×8 IMPLANT
DRAPE COLUMN DVNC XI (DISPOSABLE) ×2 IMPLANT
DRAPE DA VINCI XI ARM (DISPOSABLE) ×4
DRAPE DA VINCI XI COLUMN (DISPOSABLE) ×1
DRAPE LAPAROSCOPIC ABDOMINAL (DRAPES) ×3 IMPLANT
DRSG TEGADERM 4X4.75 (GAUZE/BANDAGES/DRESSINGS) ×3 IMPLANT
DRSG TEGADERM 6X8 (GAUZE/BANDAGES/DRESSINGS) ×6 IMPLANT
ELECT REM PT RETURN 9FT ADLT (ELECTROSURGICAL) ×3
ELECTRODE REM PT RTRN 9FT ADLT (ELECTROSURGICAL) ×2 IMPLANT
GAUZE SPONGE 2X2 8PLY STRL LF (GAUZE/BANDAGES/DRESSINGS) ×2 IMPLANT
GLOVE BIO SURGEON STRL SZ 6.5 (GLOVE) IMPLANT
GLOVE BIOGEL M STRL SZ7.5 (GLOVE) ×36 IMPLANT
GOWN STRL REUS W/TWL LRG LVL3 (GOWN DISPOSABLE) IMPLANT
GOWN STRL REUS W/TWL LRG LVL4 (GOWN DISPOSABLE) ×18 IMPLANT
HOLDER FOLEY CATH W/STRAP (MISCELLANEOUS) ×3 IMPLANT
IV LACTATED RINGERS 1000ML (IV SOLUTION) ×3 IMPLANT
KIT PROCEDURE DA VINCI SI (MISCELLANEOUS) ×1
KIT PROCEDURE DVNC SI (MISCELLANEOUS) ×2 IMPLANT
LIQUID BAND (GAUZE/BANDAGES/DRESSINGS) ×6 IMPLANT
NEEDLE INSUFFLATION 14GA 120MM (NEEDLE) ×3 IMPLANT
PACK ROBOT UROLOGY CUSTOM (CUSTOM PROCEDURE TRAY) ×3 IMPLANT
PAD POSITIONING PINK XL (MISCELLANEOUS) IMPLANT
RELOAD GREEN ECHELON 45 (STAPLE) ×3 IMPLANT
SEAL CANN UNIV 5-8 DVNC XI (MISCELLANEOUS) ×8 IMPLANT
SEAL XI 5MM-8MM UNIVERSAL (MISCELLANEOUS) ×4
SET TUBE IRRIG SUCTION NO TIP (IRRIGATION / IRRIGATOR) ×3 IMPLANT
SHEET LAVH (DRAPES) IMPLANT
SOLUTION ELECTROLUBE (MISCELLANEOUS) ×3 IMPLANT
SPONGE GAUZE 2X2 STER 10/PKG (GAUZE/BANDAGES/DRESSINGS) ×1
SPONGE LAP 4X18 X RAY DECT (DISPOSABLE) ×3 IMPLANT
SUT ETHILON 3 0 PS 1 (SUTURE) ×3 IMPLANT
SUT MNCRL AB 4-0 PS2 18 (SUTURE) ×6 IMPLANT
SUT PDS AB 1 CT1 27 (SUTURE) ×6 IMPLANT
SUT VIC AB 2-0 SH 27 (SUTURE) ×1
SUT VIC AB 2-0 SH 27X BRD (SUTURE) ×2 IMPLANT
SUT VICRYL 0 UR6 27IN ABS (SUTURE) ×3 IMPLANT
SUT VLOC BARB 180 ABS3/0GR12 (SUTURE) ×9
SUTURE VLOC BRB 180 ABS3/0GR12 (SUTURE) ×6 IMPLANT
SYR 27GX1/2 1ML LL SAFETY (SYRINGE) ×3 IMPLANT
TOWEL OR 17X26 10 PK STRL BLUE (TOWEL DISPOSABLE) ×3 IMPLANT
TOWEL OR NON WOVEN STRL DISP B (DISPOSABLE) ×3 IMPLANT
WATER STERILE IRR 1500ML POUR (IV SOLUTION) ×6 IMPLANT

## 2015-04-20 NOTE — Progress Notes (Signed)
Day of Surgery Subjective: Patient reports no complaints. No N/V. Pain controlled. Has not ambulated yet.   Objective: Vital signs in last 24 hours: Temp:  [97.3 F (36.3 C)-98.8 F (37.1 C)] 98.3 F (36.8 C) (12/14 1419) Pulse Rate:  [84-94] 92 (12/14 1419) Resp:  [10-18] 14 (12/14 1419) BP: (121-143)/(75-95) 143/85 mmHg (12/14 1419) SpO2:  [91 %-100 %] 97 % (12/14 1419) Weight:  [121.281 kg (267 lb 6 oz)] 121.281 kg (267 lb 6 oz) (12/14 [id number])  Intake/Output from previous day:   Intake/Output this shift: Total I/O In: 2825 [I.V.:1775; IV Piggyback:1050] Out: 49 [Urine:280; Drains:90; Blood:200]  Physical Exam:  General:alert, cooperative and appears stated age  Resp: No increased WOB on RA GI: soft, appropriately TTP, non-distended, incisions C/D/I, JP with SS output  Male genitalia: foley catheter in place with thin, light, pink urine without clots or debris Extremities: extremities normal, atraumatic, no cyanosis or edema  Lab Results:  Recent Labs  04/20/15 1300  HGB 12.9*  HCT 39.1   BMET No results for input(s): NA, K, CL, CO2, GLUCOSE, BUN, CREATININE, CALCIUM in the last 72 hours. No results for input(s): LABPT, INR in the last 72 hours. No results for input(s): LABURIN in the last 72 hours. No results found for this or any previous visit.  Studies/Results: No results found.  Assessment/Plan: Day of Surgery Procedure(s) (LRB): ROBOTIC ASSISTED LAPAROSCOPIC RADICAL PROSTATECTOMY LEVEL 2 (N/A) BILATERAL LYMPHADENECTOMY (Bilateral)   POD#0 RALP & BPLND, recovering well.   Clears tongiht  mIVF  Foley  F/u UOP  F/u JP output  Ambulate tonight  AM labs  Possible D/C PM of POD#1 or AM POD#2   LOS: 0 days   Acie Fredrickson 04/20/2015, 4:35 PM   I have seen and examined the patient and agree with above assesment and plan.  Briefly:  S: POD 0 s/p prostatectomy  O: NAD AOx3 SNTND, JP scant serosanguinaous fluid Foley dark yellow  thin urine SCD's in place  PACU Hgb >10  A/P  Doing well POD 0. Likely DC tomorrow.

## 2015-04-20 NOTE — H&P (Signed)
Jerry Hudson is an 64 y.o. male.    Chief Complaint: Pre-OP Prostatectomy  HPI:   1-Low risk prostate cancer-  May 2016 Low Risk Prostate Cancer - T1c, PSA 8.8; prostate 48 grams (no median lobe by TRUS). Gleason 3+3 = 6 4 / 12 cores including RLM, RLM, LLM, LLA. No Family history of prostate cancer.  No history of BPH or lower urinary tract symptoms.    PMH sig for knee arthroscopy, Gout. NO CV disease. NO strong blood thinners. His PCP is Bing Matter, [place] with [facility].  Today " Jerry Hudson " is seen to proceed with robotic prostatecotmy with limited pelvic lymphadenectomy.   Past Medical History  Diagnosis Date  . Hypertension   . Numbness and tingling     in feet bilat comes and goes   . Cancer [facility])     prostate cancer  . Trauma     great right toe laceration secondary to lawnmower accident     Past Surgical History  Procedure Laterality Date  . Knee arthroscopy  2006    both knees    Family History  Problem Relation Age of Onset  . Cancer Mother   . Cancer Father   . Diabetes Sister    Social History:  reports that he has never smoked. He has never used smokeless tobacco. He reports that he drinks alcohol. He reports that he does not use illicit drugs.  Allergies: No Known Allergies  Medications Prior to Admission  Medication Sig Dispense Refill  . allopurinol (ZYLOPRIM) 300 MG tablet Take 300 mg by mouth daily.  0  . aspirin 325 MG tablet Take 325 mg by mouth daily.    Marland Kitchen BENICAR HCT 40-12.5 MG tablet Take 1 tablet by mouth daily.  1    No results found for this or any previous visit (from the past 48 hour(s)). No results found.  Review of Systems  Constitutional: Negative.   HENT: Negative.   Eyes: Negative.   Respiratory: Negative.   Cardiovascular: Negative.   Gastrointestinal: Negative.   Genitourinary: Negative.   Musculoskeletal: Negative.   Skin: Negative.   Neurological: Negative.   Endo/Heme/Allergies: Negative.    Psychiatric/Behavioral: Negative.     Blood pressure 140/91, pulse 85, temperature 98.2 F (36.8 C), temperature source Oral, resp. rate 18, height 6\' 1"  (1.854 m), weight 121.281 kg (267 lb 6 oz), SpO2 97 %. Physical Exam  Constitutional: He appears well-developed.  HENT:  Head: Normocephalic.  Eyes: Pupils are equal, round, and reactive to light.  Neck: Normal range of motion.  Cardiovascular: Normal rate.   Respiratory: Effort normal.  GI: Soft.  Genitourinary:  No CVAT  Musculoskeletal: Normal range of motion.  Neurological: He is alert.  Skin: Skin is warm.  Psychiatric: He has a normal mood and affect. His behavior is normal. Judgment and thought content normal.     Assessment/Plan  1-Low risk prostate cancer-  We rediscussed prostatectomy and specifically robotic prostatectomy with bilateral pelvic lymphadenectomy being the technique that I most commonly perform. I reshowed the patient on their abdomen the approximately 6 small incision (trocar) sites as well as presumed extraction sites with robotic approach as well as possible open incision sites should open conversion be necessary. We rediscussed peri-operative risks including bleeding, infection, deep vein thrombosis, pulmonary embolism, compartment syndrome, nuropathy / neuropraxia, heart attack, stroke, death, as well as long-term risks such as non-cure / need for additional therapy. We specifically readdressed that the procedure would compromise urinary control leading  to stress incontinence which typically resolves with time and pelvic rehabilitation (Kegel's, etc..), but can sometimes be permanent and require additional therapy including surgery. We also specifically readdressed sexual sequellae including significant erectile dysfunction which typically partially resolves with time but can also be permanent and require additional therapy including surgery.   We rediscussed the typical hospital course including usual 1-2  night hospitalization, discharge with foley catheter in place usually for 1-2 weeks before voiding trial as well as usually 2 week recovery until able to perform most non-strenuous activity and 6 weeks until able to return to most jobs and more strenuous activity such as exercise.   He voiced understanding and desire to proceed today as planned.   Marye Eagen 04/20/2015, 7:19 AM

## 2015-04-20 NOTE — Anesthesia Procedure Notes (Signed)
Procedure Name: Intubation Date/Time: 04/20/2015 8:57 AM Performed by: Lajuana Carry E Pre-anesthesia Checklist: Patient identified, Emergency Drugs available, Suction available and Patient being monitored Patient Re-evaluated:Patient Re-evaluated prior to inductionOxygen Delivery Method: Circle System Utilized Preoxygenation: Pre-oxygenation with 100% oxygen Intubation Type: IV induction Ventilation: Mask ventilation without difficulty Laryngoscope Size: Glidescope Grade View: Grade I Tube type: Oral Number of attempts: 2 Airway Equipment and Method: Stylet,  Oral airway and Video-laryngoscopy Placement Confirmation: ETT inserted through vocal cords under direct vision,  positive ETCO2 and breath sounds checked- equal and bilateral Secured at: 23 cm Tube secured with: Tape Dental Injury: Teeth and Oropharynx as per pre-operative assessment  Difficulty Due To: Difficult Airway- due to anterior larynx Comments: Initial attempt with Miller 3, grade 4 view, unable to pass ETT, Easy mask ventilation with oral airway, atraumatic intubation with Glidescope, grade 1 view.

## 2015-04-20 NOTE — Brief Op Note (Signed)
04/20/2015  12:23 PM  PATIENT:  Jerry Hudson  64 y.o. male  PRE-OPERATIVE DIAGNOSIS:  PROSTATE CANCER  POST-OPERATIVE DIAGNOSIS:  PROSTATE CANCER  PROCEDURE:  Procedure(s): ROBOTIC ASSISTED LAPAROSCOPIC RADICAL PROSTATECTOMY LEVEL 2 (N/A) BILATERAL LYMPHADENECTOMY (Bilateral)  SURGEON:  Surgeon(s) and Role:    * Alexis Frock, MD - Primary  PHYSICIAN ASSISTANT:   ASSISTANTS: Debbrah Alar, PA   ANESTHESIA:   general  EBL:  Total I/O In: 1000 [I.V.:1000] Out: 200 [Blood:200]  BLOOD ADMINISTERED:none  DRAINS: JP to bulb, Foley to gravity   LOCAL MEDICATIONS USED:  MARCAINE     SPECIMEN:  Source of Specimen:  pelvic lymph nodes, periprostatic fat, prostatecotmy  DISPOSITION OF SPECIMEN:  PATHOLOGY  COUNTS:  YES  TOURNIQUET:  * No tourniquets in log *  DICTATION: .Other Dictation: Dictation Number [id number]  PLAN OF CARE: Admit to inpatient   PATIENT DISPOSITION:  PACU - hemodynamically stable.   Delay start of Pharmacological VTE agent (>24hrs) due to surgical blood loss or risk of bleeding: yes

## 2015-04-20 NOTE — Anesthesia Postprocedure Evaluation (Signed)
Anesthesia Post Note  Patient: Jerry Hudson  Procedure(s) Performed: Procedure(s) (LRB): ROBOTIC ASSISTED LAPAROSCOPIC RADICAL PROSTATECTOMY LEVEL 2 (N/A) BILATERAL LYMPHADENECTOMY (Bilateral)  Patient location during evaluation: PACU Anesthesia Type: General Level of consciousness: awake and alert Pain management: pain level controlled Vital Signs Assessment: post-procedure vital signs reviewed and stable Respiratory status: spontaneous breathing, nonlabored ventilation, respiratory function stable and patient connected to nasal cannula oxygen Cardiovascular status: blood pressure returned to baseline and stable Postop Assessment: no signs of nausea or vomiting Anesthetic complications: no    Last Vitals:  Filed Vitals:   04/20/15 1245 04/20/15 1300  BP: 141/94 141/93  Pulse: 89 89  Temp:    Resp: 12 10    Last Pain: There were no vitals filed for this visit.               Marnee Sherrard L

## 2015-04-20 NOTE — Transfer of Care (Signed)
Immediate Anesthesia Transfer of Care Note  Patient: Jerry Hudson  Procedure(s) Performed: Procedure(s): ROBOTIC ASSISTED LAPAROSCOPIC RADICAL PROSTATECTOMY LEVEL 2 (N/A) BILATERAL LYMPHADENECTOMY (Bilateral)  Patient Location: PACU  Anesthesia Type:General  Level of Consciousness:  sedated, patient cooperative and responds to stimulation  Airway & Oxygen Therapy:Patient Spontanous Breathing and Patient connected to face mask oxgen  Post-op Assessment:  Report given to PACU RN and Post -op Vital signs reviewed and stable  Post vital signs:  Reviewed and stable  Last Vitals:  Filed Vitals:   04/20/15 0623  BP: 140/91  Pulse: 85  Temp: 36.8 C  Resp: 18    Complications: No apparent anesthesia complications

## 2015-04-20 NOTE — Discharge Instructions (Signed)

## 2015-04-21 LAB — HEMOGLOBIN AND HEMATOCRIT, BLOOD
HCT: 35.7 % — ABNORMAL LOW (ref 39.0–52.0)
Hemoglobin: 11.8 g/dL — ABNORMAL LOW (ref 13.0–17.0)

## 2015-04-21 LAB — BASIC METABOLIC PANEL
ANION GAP: 8 (ref 5–15)
BUN: 15 mg/dL (ref 6–20)
CHLORIDE: 104 mmol/L (ref 101–111)
CO2: 27 mmol/L (ref 22–32)
Calcium: 8.6 mg/dL — ABNORMAL LOW (ref 8.9–10.3)
Creatinine, Ser: 1.08 mg/dL (ref 0.61–1.24)
GFR calc Af Amer: >60 mL/min (ref >60)
GLUCOSE: 149 mg/dL — AB (ref 65–99)
POTASSIUM: 4.3 mmol/L (ref 3.5–5.1)
Sodium: 139 mmol/L (ref 135–145)

## 2015-04-21 LAB — CREATININE, FLUID (PLEURAL, PERITONEAL, JP DRAINAGE): CREAT FL: 1.1 mg/dL

## 2015-04-21 MED ORDER — SENNOSIDES-DOCUSATE SODIUM 8.6-50 MG PO TABS: 1.0000 | ORAL_TABLET | Freq: Two times a day (BID) | ORAL | Status: DC

## 2015-04-21 MED ORDER — OXYCODONE HCL 5 MG PO TABS
5.0000 mg | ORAL_TABLET | ORAL | Status: DC | PRN
  Administered 2015-04-21 (×2): 10 mg via ORAL
  Filled 2015-04-21 (×2): qty 2

## 2015-04-21 MED ORDER — BISACODYL 10 MG RE SUPP
10.0000 mg | Freq: Once | RECTAL | Status: AC
Start: 2015-04-21 — End: 2015-04-21
  Administered 2015-04-21: 10 mg via RECTAL
  Filled 2015-04-21: qty 1

## 2015-04-21 NOTE — Op Note (Signed)
[name], Hudson NO.:  [id number]  MEDICAL RECORD NO.:  [id number]  LOCATION:  1440                         FACILITY:  [facility]  PHYSICIAN:  Alexis Frock, MD     DATE OF BIRTH:  08/29/1950  DATE OF PROCEDURE: 04/20/2015                              OPERATIVE REPORT  DIAGNOSIS:  Low-risk prostate cancer.  PROCEDURES: 1. Robotic-assisted laparoscopic prostatectomy. 2. Bilateral pelvic lymphadenectomy.  ESTIMATED BLOOD LOSS:  200 mL.  COMPLICATIONS:  None.  SPECIMENS: 1. Periprostatic fat. 2. Prostatectomy. 3. Right external iliac lymph nodes. 4. Right obturator lymph nodes. 5. Left external iliac lymph nodes. 6. Left obturator lymph nodes.  DRAINS: 1. JP to bulb suction. 2. Foley catheter to straight drain.  ASSISTANT:  Debbrah Alar, PA.  INDICATION:  Jerry Hudson is a very pleasant 64 year old gentleman with history of known low-risk adenocarcinoma of the prostate that is multifocal.  Options were discussed for management of this including surveillance protocol versus ablative therapies versus surgical extirpation with and without minimally invasive assistance and after being well counseled by multiple providers, being quite well and informed that the patient has elected to undergo radical prostatectomy with curative intent.  Informed consent was signed and placed in the medical record.  PROCEDURE IN DETAIL:  The patient being Jerry Hudson, was verified. Procedure being prostatectomy was confirmed.  Procedure was carried out. Time-out was performed.  Intravenous antibiotics were administered. General endotracheal anesthesia was introduced.  The patient was placed into a supine position tucking his arms at his side making sure to pad his bony prominences.  He was placed on a pink nonskid pad.  A test of steep Trendelenburg positioning was performed after 3-inch tape was applied over his supraxiphoid chest and he was found to be  suitably positioned.  Foley catheter was placed per urethra to straight drain after prepping and draping the penis, perineum and proximal thighs using iodine in his infra-xiphoid abdomen using chlorhexidine gluconate.  A high-flow, low-pressure pneumoperitoneum was obtained using Veress technique in the infraumbilical midline having passed the aspiration and drop test.  An 8-mm robotic camera port was placed in same location. Laparoscopic examination of the peritoneal cavity revealed no significant adhesions, no visceral injury.  Additional ports were then placed as follows:  Right paramedian 8-mm robotic port, right far lateral 12-mm assistant port, right paramedian 5-mm suction port, left paramedian 8-mm robotic port, left far lateral 8-mm robotic port.  Robot was docked and passed through the electronic checks.  Next, initial attention was directed at development of space of Retzius.  Incision was made lateral to the left medial umbilical ligament from the midline towards the area of the internal ring coursing along the iliac vessels towards the area of the ureter.  The vas deferens was encountered, purposely ligated and used as a bucket-handle, which provided medial retraction and the left bladder wall was dissected free from the pelvic sidewall towards the area of the endopelvic fascia, which was carefully swept away from the lateral aspect of the prostate in a base-to-apex orientation.  A mirror-imaged dissection was performed on the right side to freeing up the right lateral bladder wall transecting the right vas deferens  and dissecting the endopelvic fascia on the right side away from the lateral aspect of the prostate.  Anterior attachments were taken down using cautery scissors.  This exposed the anterior base of the prostate, which was defatted to better expose the junction between the bladder neck and prostate.  This was set aside and labeled periprostatic fat.  This exposed  the area of the dorsal venous complex, which was controlled using vascular load stapler, taking great care to avoid the membranous urethral injury, which did not occur.  Attention was then directed at lymphadenectomy.  Template lymphadenectomy was performed first on the right side with the confines being right external iliac artery, vein, pelvic side wall and iliac bifurcation. Lymphostasis was achieved with cold clips.  The was labeled right external iliac lymph nodes.  Similarly, the right obturator group was dissected free with confines being right external iliac vein, obturator nerve and pelvic side wall.  This was set aside and labeled the right obturator lymph nodes.  The obturator nerve was inspected following these maneuvers and found to be uninjured.  A mirror-imaged lymphadenectomy was performed on the left side of the left external and left obturator groups respectively.  The left obturator nerve was also inspected and found to be uninjured.  Next, bladder neck was identified by removing the Foley catheter back and forth in the anterior plane and initial lateral release was performed followed by transection of the bladder neck in the anterior-posterior direction, keeping what appeared to be circular muscle fibers with the side of the specimen.  Posterior dissection was performed by incising approximately 7-mm inferior- posteriorly to the posterior lip of the prostrate, entering the plane of Denonvilliers.  Bilateral vas deferenses were encountered, dissected for distance of 3 cm, ligated and placed on gentle superior traction. Seminal vesicles were dissected to their tip and placed on gentle superior traction.  The plane of Denonvilliers was further developed towards the area of the apex of the prostate.  This exposed the vascular pedicles.  The pedicles were controlled using sequential clipping technique towards the area of the midportion of the prostate, at which point, cold  dissection was performed performing partial nerve-sparing on both sides towards the area of the apex.  Apical dissection was performed in the anterior plane by placing the prostate on superior traction identifying the membranous urethra and transecting coldly, keeping what appeared to be an adequate membranous urethral stump. Next, digital rectal exam was performed of the indicator glove under laparoscopic vision and no rectal violation was noted.  Posterior reconstruction was performed using a single 3-0 V-Loc suture reapproximating the posterior urethral plate to the posterior bladder neck bringing these structures into tension-free apposition.  Bladder neck to urethra anastomosis performed using double-armed V-Loc suture from the 6 o'clock to 12 o'clock position, which resulted in excellent mucosal apposition circumferentially.  A new Foley catheter was placed per urethra, which irrigated quantitatively.  At this point, all sponge and needle counts were correct.  There was excellent hemostasis.  A closed suction drain was brought through the previous left lateral most robotic port site to the area of the peritoneal cavity, as such that it was not indirect apposition to the anastomosis.  Robot was undocked. The previous right lateral most assistant port site was closed at the level of the fascia using Carter-Thomason suture passer.  Specimen was placed in an EndoCatch bag and retrieved by extending the camera port site for total distance approximately 4 cm.  The extraction site was closed  at the level of fascia using figure-of-eight PDS x3.  All incision sites were infiltrated with dilute lyophilized Marcaine and closed at the level of the skin using subcuticular Monocryl followed by Dermabond.  Procedure was terminated. The patient tolerated the procedure well.  There were no immediate periprocedural complications.  The patient was taken to the postanesthesia care unit in stable  condition.          ______________________________ Alexis Frock, MD     TM/MEDQ  D:  04/20/2015  T:  04/21/2015  Job:  [id number]

## 2015-04-21 NOTE — Discharge Summary (Signed)
Physician Discharge Summary  Patient ID: Jerry Hudson MRN: [id number] DOB/AGE: [birth year] 64 y.o.  Admit date: 04/20/2015 Discharge date: 04/21/2015  Admission Diagnoses: Prostate cancer  Discharge Diagnoses:  Active Problems:   Prostate cancer [facility])   Discharged Condition: good  Hospital Course:  64 yo male who is s/p robotic assisted laparoscopic prostatectomy with bilateral pelvic lymph node dissection repair with Dr. Tresa Moore. He did well post-operatively. His diet was slowly advanced and at the time of discharge he was tolerating a regular diet, ambulating at his baseline, having excellent UOP via foley catheter, JP drain was successfully removed after JP Cr returned equal to serum and pain was well controlled with oral narcotics. He was discharged to home on POD#1.  Consults: None  Significant Diagnostic Studies: labs: JP Cr 1.1  Treatments: surgery: robotic assisted laparoscopic prostatectomy with bilateral pelvic lymph node dissection   Discharge Exam: Blood pressure 115/64, pulse 85, temperature 98.6 F (37 C), temperature source Oral, resp. rate 18, height 6\' 1"  (1.854 m), weight 121.281 kg (267 lb 6 oz), SpO2 93 %. General:alert, cooperative and appears stated age  Resp: No increased WOB on RA GI: soft, appropriately TTP, non-distended, incisions C/D/I Male genitalia: foley catheter in place with clear yellow urine without clots or debris Extremities: extremities normal, atraumatic, no cyanosis or edema  Disposition: 01-Home or Self Care     Medication List    STOP taking these medications        aspirin 325 MG tablet      TAKE these medications        allopurinol 300 MG tablet  Commonly known as:  ZYLOPRIM  Take 300 mg by mouth daily.     BENICAR HCT 40-12.5 MG tablet  Generic drug:  olmesartan-hydrochlorothiazide  Take 1 tablet by mouth daily.     HYDROcodone-acetaminophen 5-325 MG tablet  Commonly known as:  NORCO  Take 1-2 tablets by mouth  every 6 (six) hours as needed.     sulfamethoxazole-trimethoprim 800-160 MG tablet  Commonly known as:  BACTRIM DS,SEPTRA DS  Take 1 tablet by mouth 2 (two) times daily. Start the day prior to foley removal appointment           Follow-up Information    Follow up with Alexis Frock, MD On 04/28/2015.   Specialty:  Urology   Why:  at 10:30 AM for MD visit and catheter removal. Dr. Tresa Moore will call you with pathology results when available.    Contact information:   [place] [address] [phone]       Signed: Acie Fredrickson 04/21/2015, 10:54 AM

## 2015-04-21 NOTE — Progress Notes (Signed)
1 Day Post-Op Subjective: Patient reports no complaints. No N/V, flatus or BMs. Pain controlled. Ambulated 1x last night.  Objective: Vital signs in last 24 hours: Temp:  [97.3 F (36.3 C)-98.8 F (37.1 C)] 98.6 F (37 C) (12/15 0528) Pulse Rate:  [84-95] 85 (12/15 0528) Resp:  [10-18] 18 (12/15 0528) BP: (115-143)/(64-95) 115/64 mmHg (12/15 0528) SpO2:  [91 %-100 %] 93 % (12/15 0528)  Intake/Output from previous day: 12/14 0701 - 12/15 0700 In: 3707.9 [P.O.:360; I.V.:2297.9; IV Piggyback:1050] Out: 2355 [Urine:1980; Drains:175; Blood:200] Intake/Output this shift: Total I/O In: -  Out: [id number] [Urine:1700; Drains:30]  Physical Exam:  General:alert, cooperative and appears stated age  Resp: No increased WOB on RA GI: soft, appropriately TTP, non-distended, incisions C/D/I, JP with SS output  Male genitalia: foley catheter in place with clear yellow urine without clots or debris Extremities: extremities normal, atraumatic, no cyanosis or edema  Lab Results:  Recent Labs  04/20/15 1300 04/21/15 0506  HGB 12.9* 11.8*  HCT 39.1 35.7*   BMET  Recent Labs  04/21/15 0506  NA 139  K 4.3  CL 104  CO2 27  GLUCOSE 149*  BUN 15  CREATININE 1.08  CALCIUM 8.6*   No results for input(s): LABPT, INR in the last 72 hours. No results for input(s): LABURIN in the last 72 hours. No results found for this or any previous visit.  Studies/Results: No results found.  Assessment/Plan: 1 Day Post-Op Procedure(s) (LRB): ROBOTIC ASSISTED LAPAROSCOPIC RADICAL PROSTATECTOMY LEVEL 2 (N/A) BILATERAL LYMPHADENECTOMY (Bilateral)   POD#1 RALP & BPLND, recovering well.   Regular diet  Medlock  PO pain meds  Suppository this AM  Send JP Cr  Foley  F/u UOP  F/u JP output  Ambulate, IS   Likely D/C this afternoon   LOS: 1 day   Acie Fredrickson 04/21/2015, 6:48 AM   I have seen and examined the patient and agree with above assesment and plan as outlined i nthis  note as well as my discharge summary from same date.

## 2015-04-21 NOTE — Progress Notes (Signed)
Patient discharged home with belongings, IVs removed. AVS and prescriptions given. Foley teach back done. Patient stable at time of discharge.

## 2015-08-09 DIAGNOSIS — I10 Essential (primary) hypertension: Secondary | ICD-10-CM | POA: Insufficient documentation

## 2017-01-09 ENCOUNTER — Encounter: Payer: Self-pay | Admitting: Neurology

## 2017-01-09 ENCOUNTER — Ambulatory Visit (INDEPENDENT_AMBULATORY_CARE_PROVIDER_SITE_OTHER): Payer: BLUE CROSS/BLUE SHIELD | Admitting: Neurology

## 2017-01-09 VITALS — BP 132/85 | HR 70 | Ht 72.0 in | Wt 254.0 lb

## 2017-01-09 DIAGNOSIS — G500 Trigeminal neuralgia: Secondary | ICD-10-CM

## 2017-01-09 DIAGNOSIS — G529 Cranial nerve disorder, unspecified: Secondary | ICD-10-CM | POA: Diagnosis not present

## 2017-01-09 MED ORDER — GABAPENTIN 100 MG PO CAPS: 100.0000 mg | ORAL_CAPSULE | Freq: Three times a day (TID) | ORAL | 6 refills | Status: DC | PRN

## 2017-01-09 NOTE — Patient Instructions (Signed)
1. Recommend Nerve Blocks 2. Recommend MRI of the brain/face with Trigeminal nerve protocol 3. Gabapentin 100mg  three times a day as needed  Nerve Block Patient Information  Description: Trigeminal neuralgia is an irritation of nerves which can cause headaches, pain and numbness of the scalp and face .   The nerve block will interrupt nerve transmission through these nerves and can relieve pain and spasm.  The block consists of insertion of a small needle under the skin in the back of the head to deposit local anesthetic (numbing medicine) and/or steroids around the nerve.  The entire block usually lasts less than 5 minutes.  Conditions which may be treated by blocks:   Muscular pain and spasm of the scalp  Nerve irritation, back of the head  Headaches  Upper neck pain   Possible side-effects:   Bleeding from needle site  Infection (rare, may require surgery)  Nerve injury (rare)  Hair on back of neck can be tinged with iodine scrub (this will wash out)  Light-headedness (temporary)  Pain at injection site (several days)  Decreased blood pressure (rare, temporary)  Seizure (very rare)  Call if you experience:   Hives or difficulty breathing ( go to the emergency room)  Inflammation or drainage at the injection site(s)  Please note:  Although the local anesthetic injected can often make your painful muscles or headache feel good for several hours after the injection, the pain may return.  It takes 3-7 days for steroids to work.  You may not notice any pain relief for at least one week.  If effective, we will often do a series of injections spaced 3-6 weeks apart to maximally decrease your pain.  Gabapentin capsules or tablets What is this medicine? GABAPENTIN (GA ba pen tin) is used to control partial seizures in adults with epilepsy. It is also used to treat certain types of nerve pain. This medicine may be used for other purposes; ask your health care provider  or pharmacist if you have questions. COMMON BRAND NAME(S): Active-PAC with Gabapentin, Gabarone, Neurontin What should I tell my health care provider before I take this medicine? They need to know if you have any of these conditions: -kidney disease -suicidal thoughts, plans, or attempt; a previous suicide attempt by you or a family member -an unusual or allergic reaction to gabapentin, other medicines, foods, dyes, or preservatives -pregnant or trying to get pregnant -breast-feeding How should I use this medicine? Take this medicine by mouth with a glass of water. Follow the directions on the prescription label. You can take it with or without food. If it upsets your stomach, take it with food.Take your medicine at regular intervals. Do not take it more often than directed. Do not stop taking except on your doctor's advice. If you are directed to break the 600 or 800 mg tablets in half as part of your dose, the extra half tablet should be used for the next dose. If you have not used the extra half tablet within 28 days, it should be thrown away. A special MedGuide will be given to you by the pharmacist with each prescription and refill. Be sure to read this information carefully each time. Talk to your pediatrician regarding the use of this medicine in children. Special care may be needed. Overdosage: If you think you have taken too much of this medicine contact a poison control center or emergency room at once. NOTE: This medicine is only for you. Do not share this medicine with  others. What if I miss a dose? If you miss a dose, take it as soon as you can. If it is almost time for your next dose, take only that dose. Do not take double or extra doses. What may interact with this medicine? Do not take this medicine with any of the following medications: -other gabapentin products This medicine may also interact with the following medications: -alcohol -antacids -antihistamines for allergy,  cough and cold -certain medicines for anxiety or sleep -certain medicines for depression or psychotic disturbances -homatropine; hydrocodone -naproxen -narcotic medicines (opiates) for pain -phenothiazines like chlorpromazine, mesoridazine, prochlorperazine, thioridazine This list may not describe all possible interactions. Give your health care provider a list of all the medicines, herbs, non-prescription drugs, or dietary supplements you use. Also tell them if you smoke, drink alcohol, or use illegal drugs. Some items may interact with your medicine. What should I watch for while using this medicine? Visit your doctor or health care professional for regular checks on your progress. You may want to keep a record at home of how you feel your condition is responding to treatment. You may want to share this information with your doctor or health care professional at each visit. You should contact your doctor or health care professional if your seizures get worse or if you have any new types of seizures. Do not stop taking this medicine or any of your seizure medicines unless instructed by your doctor or health care professional. Stopping your medicine suddenly can increase your seizures or their severity. Wear a medical identification bracelet or chain if you are taking this medicine for seizures, and carry a card that lists all your medications. You may get drowsy, dizzy, or have blurred vision. Do not drive, use machinery, or do anything that needs mental alertness until you know how this medicine affects you. To reduce dizzy or fainting spells, do not sit or stand up quickly, especially if you are an older patient. Alcohol can increase drowsiness and dizziness. Avoid alcoholic drinks. Your mouth may get dry. Chewing sugarless gum or sucking hard candy, and drinking plenty of water will help. The use of this medicine may increase the chance of suicidal thoughts or actions. Pay special attention to how  you are responding while on this medicine. Any worsening of mood, or thoughts of suicide or dying should be reported to your health care professional right away. Women who become pregnant while using this medicine may enroll in the [place] Pregnancy Registry by calling [phone]. This registry collects information about the safety of antiepileptic drug use during pregnancy. What side effects may I notice from receiving this medicine? Side effects that you should report to your doctor or health care professional as soon as possible: -allergic reactions like skin rash, itching or hives, swelling of the face, lips, or tongue -worsening of mood, thoughts or actions of suicide or dying Side effects that usually do not require medical attention (report to your doctor or health care professional if they continue or are bothersome): -constipation -difficulty walking or controlling muscle movements -dizziness -nausea -slurred speech -tiredness -tremors -weight gain This list may not describe all possible side effects. Call your doctor for medical advice about side effects. You may report side effects to FDA at 1-800-FDA-1088. Where should I keep my medicine? Keep out of reach of children. This medicine may cause accidental overdose and death if it taken by other adults, children, or pets. Mix any unused medicine with a substance like cat  litter or coffee grounds. Then throw the medicine away in a sealed container like a sealed bag or a coffee can with a lid. Do not use the medicine after the expiration date. Store at room temperature between 15 and 30 degrees C (59 and 86 degrees F). NOTE: This sheet is a summary. It may not cover all possible information. If you have questions about this medicine, talk to your doctor, pharmacist, or health care provider.  2018 Elsevier/Gold Standard (2013-06-19 15:26:50)

## 2017-01-09 NOTE — Progress Notes (Signed)
[facility]    Provider:  Dr Jaynee Eagles Referring Provider: Aletha Halim., PA-C Primary Care Physician:  Aletha Halim., PA-C  CC:  Trigeminal Neuralgia  HPI:  Jerry Hudson is a 66 y.o. male here as a referral from Dr. Deatra Ina for Trigeminal Neuralgia. PMHx Trigeminal neuralgia and HTN. Facial pain for 5 years. Would occur twice a year in the past but now getting worse and more frequent and has had to have acute management twice this year and had more "flare ups". He had pain from the right facial area radiating to the nose but more recently pain is in the forehead, electric shooting, severe (points to the right supraorbital area). It can last 20-30 seconds repeatedly throughout the day. It can last for days and ongoing. Steroids help and he goes to the pcp. Then he will be fine for a few months and it will get back. In between he is fine no vision changes. It is severe. Unknown triggers, so pressure may trigger but not chewing or brushing however chewing will aggravate it when it is occurring. No known inciting event or trauma. No other focal neurologic deficits, associated symptoms, inciting events or modifiable factors.  Reviewed notes, labs and imaging from outside physicians, which showed:  Reviewed notes, patient was seen by neurology in November 2014 for right sided facial pain over the right cheek radiation into the nose and eyes. Described as tingling. No associated numbness. Symptoms were not exacerbated by chewing, talking or cold air. Light pressure over the right cheek with occasionally worsen his pain. His PCP started him on prednisone and an MRI of the brain was ordered. At the time of appointment his symptoms had completely resolved. MRI of the brain was unremarkable. Neurologic exam was nonfocal. He was diagnosed with trigeminal neuralgia. No shingles or history of vesicular eruption over the face or rash.  MRI brain: Personally reviewed images and agree  with the following IMPRESSION: Scattered small white matter hyperintensities bilaterally. These have a chronic appearance and may be due to chronic microvascular ischemia or migraine headache.  Review of Systems: Patient complains of symptoms per HPI as well as the following symptoms: No rash, no CP. Pertinent negatives and positives per HPI. All others negative.   Social History   Social History  . Marital status: Married    Spouse name: N/A  . Number of children: N/A  . Years of education: N/A   Occupational History  . Not on file.   Social History Main Topics  . Smoking status: Never Smoker  . Smokeless tobacco: Never Used  . Alcohol use Yes     Comment: Occasionally  . Drug use: No  . Sexual activity: Not on file   Other Topics Concern  . 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    Family History  Problem Relation Age of Onset  . Cancer Mother   . Cancer Father   . Diabetes Sister     Past Medical History:  Diagnosis Date  . Cancer [facility])    prostate cancer  . Hypertension   . Numbness and tingling    in feet bilat comes and goes   . Trauma    great right toe laceration secondary to lawnmower accident     Past Surgical History:  Procedure Laterality Date  . KNEE ARTHROSCOPY  2006   both knees  . LYMPHADENECTOMY Bilateral  04/20/2015   Procedure: BILATERAL LYMPHADENECTOMY;  Surgeon: Alexis Frock, MD;  Location: [facility];  Service: Urology;  Laterality: Bilateral;  . ROBOT ASSISTED LAPAROSCOPIC RADICAL PROSTATECTOMY N/A 04/20/2015   Procedure: ROBOTIC ASSISTED LAPAROSCOPIC RADICAL PROSTATECTOMY LEVEL 2;  Surgeon: Alexis Frock, MD;  Location: [facility];  Service: Urology;  Laterality: N/A;    Current Outpatient Prescriptions  Medication Sig Dispense Refill  . aspirin 325 MG tablet Take 325 mg by mouth daily.    Marland Kitchen allopurinol (ZYLOPRIM) 300 MG tablet Take 300 mg by mouth  daily.  0  . BENICAR HCT 40-12.5 MG tablet Take 1 tablet by mouth daily.  1  . gabapentin (NEURONTIN) 100 MG capsule Take 1 capsule (100 mg total) by mouth 3 (three) times daily as needed. 90 capsule 6   No current facility-administered medications for this visit.     Allergies as of 01/09/2017  . (No Known Allergies)    Vitals: BP 132/85   Pulse 70   Ht 6' (1.829 m)   Wt 254 lb (115.2 kg)   BMI 34.45 kg/m  Last Weight:  Wt Readings from Last 1 Encounters:  01/09/17 254 lb (115.2 kg)   Last Height:   Ht Readings from Last 1 Encounters:  01/09/17 6' (1.829 m)    Physical exam: Exam: Gen: NAD, conversant, well nourised, obese, well groomed                     CV: RRR, no MRG. No Carotid Bruits. No peripheral edema, warm, nontender Eyes: Conjunctivae clear without exudates or hemorrhage  Neuro: Detailed Neurologic Exam  Speech:    Speech is normal; fluent and spontaneous with normal comprehension.  Cognition:    The patient is oriented to person, place, and time;     recent and remote memory intact;     language fluent;     normal attention, concentration,     fund of knowledge Cranial Nerves:    The pupils are equal, round, and reactive to light. Attempted funduscopic exam could not visualize. Visual fields are full to finger confrontation. Extraocular movements are intact. Trigeminal sensation is intact and the muscles of mastication are normal. The face is symmetric. The palate elevates in the midline. Hearing intact. Voice is normal. Shoulder shrug is normal. The tongue has normal motion without fasciculations.   Coordination:    Normal finger to nose and heel to shin. Normal rapid alternating movements.   Gait:   Not ataxic, good stride  Motor Observation:    No asymmetry, no atrophy, and no involuntary movements noted. Tone:    Normal muscle tone.    Posture:    Posture is normal. normal erect    Strength:    Strength is V/V in the upper and lower  limbs.      Sensation: intact to LT     Reflex Exam:  DTR's:    Deep tendon reflexes in the upper and lower extremities are symmetrical bilaterally.   Toes:    The toes are downgoing bilaterally.   Clonus:    Clonus is absent.    Assessment/Plan:  66 year old with worsening trigeminal and supraorbital neuralgia.   - Recommend MRI brain w trigeminal protocol (declines) - Gabapentin/Neurontin take as needed up to 3x a day, can increase dose as needed or tolerated. Other options include Tegretol, Lyrica, Lamictal, baclofen. - Recommended Trigeminal Nerve blocks and Supraorbital Nerve Blocks (will think about it)  To prevent or relieve headaches, try the  following: Cool Compress. Lie down and place a cool compress on your head.  Avoid headache triggers. If certain foods or odors seem to have triggered your migraines in the past, avoid them. A headache diary might help you identify triggers.  Include physical activity in your daily routine. Try a daily walk or other moderate aerobic exercise.  Manage stress. Find healthy ways to cope with the stressors, such as delegating tasks on your to-do list.  Practice relaxation techniques. Try deep breathing, yoga, massage and visualization.  Eat regularly. Eating regularly scheduled meals and maintaining a healthy diet might help prevent headaches. Also, drink plenty of fluids.  Follow a regular sleep schedule. Sleep deprivation might contribute to headaches Consider biofeedback. With this mind-body technique, you learn to control certain bodily functions - such as muscle tension, heart rate and blood pressure - to prevent headaches or reduce headache pain.    Proceed to emergency room if you experience new or worsening symptoms or symptoms do not resolve, if you have new neurologic symptoms or if headache is severe, or for any concerning symptom.  Cc: Dr. Deatra Ina  F/u as needed  Sarina Ill, MD  [facility] [address] [place], [place] [address]  Phone [phone] Fax [phone]

## 2017-09-23 DIAGNOSIS — M545 Low back pain: Secondary | ICD-10-CM | POA: Diagnosis not present

## 2017-09-25 ENCOUNTER — Other Ambulatory Visit: Payer: Self-pay | Admitting: Sports Medicine

## 2017-09-25 DIAGNOSIS — M545 Low back pain: Secondary | ICD-10-CM

## 2017-10-04 ENCOUNTER — Ambulatory Visit
Admission: RE | Admit: 2017-10-04 | Discharge: 2017-10-04 | Disposition: A | Discharge status: Home / Self Care | Payer: Commercial Managed Care - HMO | Source: Ambulatory Visit | Attending: Sports Medicine | Admitting: Sports Medicine

## 2017-10-04 DIAGNOSIS — M545 Low back pain: Secondary | ICD-10-CM

## 2017-10-04 DIAGNOSIS — M48061 Spinal stenosis, lumbar region without neurogenic claudication: Secondary | ICD-10-CM | POA: Diagnosis not present

## 2017-10-07 DIAGNOSIS — M545 Low back pain: Secondary | ICD-10-CM | POA: Diagnosis not present

## 2017-10-07 DIAGNOSIS — M48061 Spinal stenosis, lumbar region without neurogenic claudication: Secondary | ICD-10-CM | POA: Diagnosis not present

## 2017-10-10 ENCOUNTER — Encounter (HOSPITAL_COMMUNITY): Payer: Self-pay | Admitting: Emergency Medicine

## 2017-10-10 ENCOUNTER — Other Ambulatory Visit: Payer: Self-pay

## 2017-10-10 ENCOUNTER — Emergency Department (HOSPITAL_COMMUNITY)
Admission: EM | Admit: 2017-10-10 | Discharge: 2017-10-10 | Disposition: A | Discharge status: Home / Self Care | Payer: Medicare Other | Attending: Emergency Medicine | Admitting: Emergency Medicine

## 2017-10-10 DIAGNOSIS — Z79899 Other long term (current) drug therapy: Secondary | ICD-10-CM | POA: Insufficient documentation

## 2017-10-10 DIAGNOSIS — I10 Essential (primary) hypertension: Secondary | ICD-10-CM | POA: Diagnosis not present

## 2017-10-10 DIAGNOSIS — Z7982 Long term (current) use of aspirin: Secondary | ICD-10-CM | POA: Insufficient documentation

## 2017-10-10 DIAGNOSIS — M545 Low back pain, unspecified: Secondary | ICD-10-CM

## 2017-10-10 MED ORDER — DIAZEPAM 5 MG PO TABS: 5.0000 mg | ORAL_TABLET | Freq: Four times a day (QID) | ORAL | 0 refills | Status: DC | PRN

## 2017-10-10 MED ORDER — OXYCODONE-ACETAMINOPHEN 5-325 MG PO TABS
1.0000 | ORAL_TABLET | Freq: Once | ORAL | Status: AC
  Administered 2017-10-10: 1 via ORAL
  Filled 2017-10-10: qty 1

## 2017-10-10 MED ORDER — OXYCODONE-ACETAMINOPHEN 5-325 MG PO TABS: 1.0000 | ORAL_TABLET | Freq: Four times a day (QID) | ORAL | 0 refills | Status: DC | PRN

## 2017-10-10 MED ORDER — DIAZEPAM 5 MG PO TABS
5.0000 mg | ORAL_TABLET | Freq: Once | ORAL | Status: AC
  Administered 2017-10-10: 5 mg via ORAL
  Filled 2017-10-10: qty 1

## 2017-10-10 NOTE — ED Notes (Signed)
Pt verbalized understanding discharge instructions and denies any further needs or questions at this time. VS stable, ambulatory and steady gait.   

## 2017-10-10 NOTE — Discharge Instructions (Signed)
Please continue taking steroid medicine, Tylenol and Aleve for your pain.  I have written you prescription for valium and percocet.  These medicines can make you drowsy so please do not drive, work or drink alcohol while taking them.  It is very important that you follow-up with your scheduled neurosurgery appointment on Monday with Dr. Ronnald Ramp.  Return to the ER if you have any new or concerning symptoms including loss of sensation in your foot or leg, loss of bowel or bladder control, weakness in which you are having trouble moving the legs.

## 2017-10-10 NOTE — ED Notes (Signed)
ED Provider at bedside. 

## 2017-10-10 NOTE — ED Provider Notes (Signed)
[facility] EMERGENCY DEPARTMENT Provider Note   CSN: [id number] Arrival date & time: 10/10/17  1508     History   Chief Complaint Chief Complaint  Patient presents with  . Back Pain    HPI Jerry Hudson is a 67 y.o. male.  HPI   Patient is a 67yo male with a history of hypertension and prostate cancer who presents to the emergency department for evaluation of lower back pain.  Patient reports that he was lifting a heavy steel plate at work and strained his lower back 5/8.  Has had pain ever since.  Was seen at [facility] orthopedics where he had an MRI 5/31 which showed lumbar spinal stenosis with disc herniation at the L4 and L5 level.  He was subsequently referred to Dr. Ronnald Ramp with neurosurgery and he has an appointment 6/10.  That his pain has progressively worsened over the week.  Reports bilateral lower back pain which is worse on the right side.  Reports pain radiates over the lateral aspect of the right thigh down to the ankle.  He states the pain is sharp and feels burning in nature.  Pain is 10/10 in severity.  Pain is worsened with ambulation, twisting or bending in any way.  He has been taking prednisone at home, no other medications given he reports tramadol, ibuprofen and Tylenol do not help.  He reports tingling sensation in his right foot, denies loss of sensation.  He has been using crutches at home so that he does not have to apply pressure to the right leg.  His daughter at bedside reports that his right foot feels colder than the left.  He denies fevers, chills, loss of bowel or bladder control, saddle anesthesia, urinary retention, loss of sensation, new weakness since MRI, dysuria, urinary frequency, abdominal pain, nausea/vomiting.  He is able to ambulate independently, although very painful.  Denies history of IV drug use.  Past Medical History:  Diagnosis Date  . Cancer [facility])    prostate cancer  . Hypertension   . Numbness and tingling    in  feet bilat comes and goes   . Trauma    great right toe laceration secondary to lawnmower accident     Patient Active Problem List   Diagnosis Date Noted  . Trigeminal neuralgia 01/09/2017  . Prostate cancer ([place]) 04/20/2015    Past Surgical History:  Procedure Laterality Date  . KNEE ARTHROSCOPY  2006   both knees  . LYMPHADENECTOMY Bilateral 04/20/2015   Procedure: BILATERAL LYMPHADENECTOMY;  Surgeon: Alexis Frock, MD;  Location: [facility] ORS;  Service: Urology;  Laterality: Bilateral;  . ROBOT ASSISTED LAPAROSCOPIC RADICAL PROSTATECTOMY N/A 04/20/2015   Procedure: ROBOTIC ASSISTED LAPAROSCOPIC RADICAL PROSTATECTOMY LEVEL 2;  Surgeon: Alexis Frock, MD;  Location: [facility] OR[facility];  Service: Urology;  Laterality: N/A;        Home Medications    Prior to Admission medications   Medication Sig Start Date End Date Taking? Authorizing Provider  allopurinol (ZYLOPRIM) 300 MG tablet Take 300 mg by mouth daily. 03/22/15   [provider]  aspirin 325 MG tablet Take 325 mg by mouth daily.    [provider]  BENICAR HCT 40-12.5 MG tablet Take 1 tablet by mouth daily. 03/23/15   [provider]  gabapentin (NEURONTIN) 100 MG capsule Take 1 capsule (100 mg total) by mouth 3 (three) times daily as needed. 01/09/17   Melvenia Beam, MD    Family History Family History  Problem  Relation Age of Onset  . Cancer Mother   . Cancer Father   . Diabetes Sister     Social History Social History   Tobacco Use  . Smoking status: Never Smoker  . Smokeless tobacco: Never Used  Substance Use Topics  . Alcohol use: Yes    Comment: Occasionally  . Drug use: No     Allergies   Patient has no known allergies.   Review of Systems Review of Systems  Constitutional: Negative for chills and fever.  Gastrointestinal: Negative for abdominal pain, nausea and vomiting.  Genitourinary: Negative for difficulty urinating, dysuria, flank pain, frequency and hematuria.    Musculoskeletal: Positive for back pain (lower) and gait problem (painful). Negative for neck pain.  Skin: Negative for rash.  Neurological: Positive for numbness (tingling in right foot, denies loss of sensation). Negative for weakness.  Psychiatric/Behavioral: Negative for agitation.     Physical Exam Updated Vital Signs BP (!) 150/93 (BP Location: Right Arm)   Pulse 70   Temp (!) 97.5 F (36.4 C) (Oral)   Resp 16   SpO2 98%   Physical Exam  Constitutional: He is oriented to person, place, and time. He appears well-developed and well-nourished. No distress.  HENT:  Head: Normocephalic and atraumatic.  Eyes: Right eye exhibits no discharge. Left eye exhibits no discharge.  Pulmonary/Chest: Effort normal. No respiratory distress.  Abdominal: Soft. There is no tenderness.  Musculoskeletal:  Tender to palpation over several spinous processes of the lumbar spine as well as right paraspinal muscle of the lumbar spine.  No overlying rash.  DP pulses 2+ bilaterally.  Neurological: He is alert and oriented to person, place, and time. Coordination normal.  Strength 5/5 in bilateral knee flexion/extension ankle dorsiflexion/plantarflexion.  Sensation to light touch intact in bilateral lower extremities, although patient reports reduced sensation in the pads of the right toes.  Gait painful, although normal in coordination and balance.  Skin: Skin is warm and dry. Capillary refill takes less than 2 seconds. He is not diaphoretic.  Psychiatric: He has a normal mood and affect. His behavior is normal.  Nursing note and vitals reviewed.    ED Treatments / Results  Labs (all labs ordered are listed, but only abnormal results are displayed) Labs Reviewed - No data to display  EKG None  Radiology No results found.  Procedures Procedures (including critical care time)  Medications Ordered in ED Medications  oxyCODONE-acetaminophen (PERCOCET/ROXICET) 5-325 MG per tablet 1 tablet (1  tablet Oral Given 10/10/17 1630)  oxyCODONE-acetaminophen (PERCOCET/ROXICET) 5-325 MG per tablet 1 tablet (1 tablet Oral Given 10/10/17 1919)  diazepam (VALIUM) tablet 5 mg (5 mg Oral Given 10/10/17 1919)     Initial Impression / Assessment and Plan / ED Course  I have reviewed the triage vital signs and the nursing notes.  Pertinent labs & imaging results that were available during my care of the patient were reviewed by me and considered in my medical decision making (see chart for details).  Clinical Course as of Oct 10 2057  Thu Oct 10, 6121  [id number] 67 year old male with prior history of some right-sided radicular symptoms here after a back injury at work with worsening of his right-sided radicular symptoms.  He had an outpatient MRI has a follow-up with the neurosurgeon on Monday but the pain is become too intense.  He is on a Medrol Dosepak without any relief.  Here he is strength is intact and he has no bowel or bladder incontinence.  We  have dosed him with some pain medicine which is helped him a lot.  I think is reasonable that we send him home with some medication and have him follow-up with his neurosurgeon outpatient.   [MB]    Clinical Course User Index [MB] Hayden Rasmussen, MD   This is a 67 year old male who presents to the emergency department for evaluation of lower back pain with right-sided radicular pain which has been ongoing since an injury last month.  Of note he had an MRI in the outpatient setting a week ago and is scheduled to follow-up with neurosurgery in four days. No saddle anesthesia, loss of bowel or bladder control or new numbness or weakness to suggest cauda equina.  Patient is able to ambulate independently, although painful.  Patient's pain managed in the emergency department and he reports that he feels much improved. Do not feel that further imaging is indicated at this time. Will prescribe a short course of pain medicine to manage his pain until he can be seen by  neurosurgery.  Counseled him that this medicine can make him drowsy and he should not drive, work or drink alcohol while taking it.  Counseled him not to take any more than his prescribed.  Discussed reasons to return to the emergency department.  Patient agrees and voiced understanding to the above plan and appears reliable for follow-up.  This was a shared visit with Dr. Melina Copa who also saw the patient and agrees with the plan.  Final Clinical Impressions(s) / ED Diagnoses   Final diagnoses:  Acute right-sided low back pain without sciatica    ED Discharge Orders        Ordered    diazepam (VALIUM) 5 MG tablet  Every 6 hours PRN     10/10/17 2106    oxyCODONE-acetaminophen (PERCOCET/ROXICET) 5-325 MG tablet  Every 6 hours PRN     10/10/17 2106       Glyn Ade, PA-C 10/11/17 0143    Hayden Rasmussen, MD 10/12/17 [phone]

## 2017-10-10 NOTE — ED Notes (Signed)
Family member inquiring if okay to give pt banana due to percocet on empty stomach, this RN said yes.

## 2017-10-10 NOTE — ED Notes (Signed)
See EDP assessment / note.   

## 2017-10-10 NOTE — ED Triage Notes (Signed)
Pt presents to eD for assessment of lower back pain since 5/8 when he lifted some steel at work.  Pt supposed to follow up with neuro-surgery.

## 2017-10-10 NOTE — ED Provider Notes (Signed)
Patient placed in Quick Look pathway, seen and evaluated   Chief Complaint: Back pain radiating into right leg  HPI:   Pt presents for evaluation of worsening back pain that radiates to right leg. Pt reports on 5/8 he injured his back while lifting a heavy piece of lead. Pt reports he saw Dr. Alfonso Ramus at [facility] and had MRI which showed L4-L5 herniated disc, and spinal stenosis. Pt has appointment on 6/10 w/ Dr. Ronnald Ramp with neurosurgery, but pain has become unbearable, currently taking prednisone and tramadol w/o improvement. No urinary retention, constipation or loss of bowel or bladder control, no saddle anesthesia. No sx in left leg. No fevers, dysuria or abdominal pain.  ROS: + back pain, right leg pain, - fevers, abdominal pain, dysuria, saddle anesthesia, incontinence   Physical Exam:   Gen: No distress  Neuro: Awake and Alert  Skin: Warm    Focused Exam: TTP over lumbar spine and right lower back, bilateral LE strength intact, sensation decreased over right leg, 2+ patellar DTRs bilaterally, dorsi and plantarflexion inatct  Initiation of care has begun. The patient has been counseled on the process, plan, and necessity for staying for the completion/evaluation, and the remainder of the medical screening examination    Janet Berlin 10/10/17 1714    Blanchie Dessert, MD 10/12/17 [phone]

## 2017-10-10 NOTE — ED Notes (Signed)
Pt rpeorts that he was picking up steel at work on May 8th. Reports increased pain over the last week, worsening everyday. Pt reports he called his neurosurgeon and orthopedist who recommended he come here. Pt is supposed to speak with neuro on Monday for consult.

## 2017-10-14 DIAGNOSIS — I10 Essential (primary) hypertension: Secondary | ICD-10-CM | POA: Diagnosis not present

## 2017-10-14 DIAGNOSIS — M48061 Spinal stenosis, lumbar region without neurogenic claudication: Secondary | ICD-10-CM | POA: Diagnosis not present

## 2017-10-14 DIAGNOSIS — Z6832 Body mass index (BMI) 32.0-32.9, adult: Secondary | ICD-10-CM | POA: Diagnosis not present

## 2017-10-31 DIAGNOSIS — M150 Primary generalized (osteo)arthritis: Secondary | ICD-10-CM | POA: Diagnosis not present

## 2017-10-31 DIAGNOSIS — Z6835 Body mass index (BMI) 35.0-35.9, adult: Secondary | ICD-10-CM | POA: Diagnosis not present

## 2017-10-31 DIAGNOSIS — G500 Trigeminal neuralgia: Secondary | ICD-10-CM | POA: Diagnosis not present

## 2017-10-31 DIAGNOSIS — M1A09X0 Idiopathic chronic gout, multiple sites, without tophus (tophi): Secondary | ICD-10-CM | POA: Diagnosis not present

## 2017-11-01 DIAGNOSIS — M545 Low back pain: Secondary | ICD-10-CM | POA: Diagnosis not present

## 2017-11-01 DIAGNOSIS — M48061 Spinal stenosis, lumbar region without neurogenic claudication: Secondary | ICD-10-CM | POA: Diagnosis not present

## 2017-11-25 DIAGNOSIS — M48061 Spinal stenosis, lumbar region without neurogenic claudication: Secondary | ICD-10-CM | POA: Diagnosis not present

## 2017-12-31 DIAGNOSIS — M48061 Spinal stenosis, lumbar region without neurogenic claudication: Secondary | ICD-10-CM | POA: Diagnosis not present

## 2018-01-07 DIAGNOSIS — H5213 Myopia, bilateral: Secondary | ICD-10-CM | POA: Diagnosis not present

## 2018-02-03 DIAGNOSIS — I10 Essential (primary) hypertension: Secondary | ICD-10-CM | POA: Diagnosis not present

## 2018-02-10 DIAGNOSIS — I10 Essential (primary) hypertension: Secondary | ICD-10-CM | POA: Diagnosis not present

## 2018-03-03 DIAGNOSIS — G500 Trigeminal neuralgia: Secondary | ICD-10-CM | POA: Diagnosis not present

## 2018-03-04 DIAGNOSIS — C61 Malignant neoplasm of prostate: Secondary | ICD-10-CM | POA: Diagnosis not present

## 2018-03-11 DIAGNOSIS — N5201 Erectile dysfunction due to arterial insufficiency: Secondary | ICD-10-CM | POA: Diagnosis not present

## 2018-03-11 DIAGNOSIS — N393 Stress incontinence (female) (male): Secondary | ICD-10-CM | POA: Diagnosis not present

## 2018-03-11 DIAGNOSIS — C61 Malignant neoplasm of prostate: Secondary | ICD-10-CM | POA: Diagnosis not present

## 2018-04-07 ENCOUNTER — Telehealth: Payer: Self-pay | Admitting: Neurology

## 2018-04-07 NOTE — Telephone Encounter (Signed)
Spoke with Dr. Jaynee Eagles. We can offer steroids x 5 days, offer appt with NP, or offer nerve blocks. Will see what pt wants to do. Called pt on his home # and mobile # asking for call back. Left office number in message along with office hours.

## 2018-04-07 NOTE — Telephone Encounter (Signed)
Spoke with Cecille Rubin @ [facility]. She said the patient called them today and sounded pretty miserable. She wanted to see if we could get him in sooner. RN advised that for now there are no available appointments but will d/w Dr. Jaynee Eagles.

## 2018-04-07 NOTE — Telephone Encounter (Signed)
[facility] in [place] has called stating Dr @ practice would like to know if pt can be seen by Dr Jaynee Eagles before March since pt is having a Trigeminal neuralgia flare up.  Cecille Rubin is asking for a call

## 2018-04-08 NOTE — Telephone Encounter (Addendum)
Patient returned my call. He stated that his pcp called in prednisone and he picked that up last night. His idea was to take the prednisone and see if it helps and call back if needed. Discussed an appointment with pt & Dr. Jaynee Eagles. Pt will try the prednisone first. RN asked for a call back regardless of outcome so we have an update. Will try to get him in asap if appointment needed. Pt verbalized understanding and appreciation.

## 2018-05-09 NOTE — Telephone Encounter (Signed)
Pt is calling back with the outcome of the medication and would like a call back from the RN. Please advise.

## 2018-05-13 ENCOUNTER — Telehealth: Payer: Self-pay | Admitting: Neurology

## 2018-05-13 NOTE — Telephone Encounter (Signed)
Pt called stating he had called last week and is still waiting for a call back regarding the pain in the nerve of his R eye. Please advise

## 2018-05-13 NOTE — Telephone Encounter (Signed)
Per Dr. Jaynee Eagles, he can be added to her scheduled on 05/14/2018.  She has a 2pm slot open.  He was informed to arrive at 1:30pm for check in.

## 2018-05-14 ENCOUNTER — Encounter: Payer: Self-pay | Admitting: Neurology

## 2018-05-14 ENCOUNTER — Ambulatory Visit: Payer: Medicare Other | Admitting: Neurology

## 2018-05-14 VITALS — BP 147/90 | HR 80 | Ht 72.0 in | Wt 258.0 lb

## 2018-05-14 DIAGNOSIS — R519 Headache, unspecified: Secondary | ICD-10-CM

## 2018-05-14 DIAGNOSIS — G500 Trigeminal neuralgia: Secondary | ICD-10-CM | POA: Diagnosis not present

## 2018-05-14 DIAGNOSIS — R51 Headache: Secondary | ICD-10-CM

## 2018-05-14 MED ORDER — GABAPENTIN (ONCE-DAILY) 300 & 600 MG PO MISC: 1800.0000 mg | Freq: Every day | ORAL | 0 refills | Status: DC

## 2018-05-14 NOTE — Progress Notes (Signed)
[facility]    Provider:  Dr Jaynee Eagles Referring Provider: Aletha Halim., PA-C Primary Care Physician:  Aletha Halim., PA-C  CC:  Trigeminal Neuralgia  Last seen several years ago and he has had a flare up now since the end of October. Tried steroids for 9 days and didn't help. Just started gabapentin. Used to be a shooting electric pain now is more constant around his eye and the mouth.  HPI:  Jerry Hudson is a 68 y.o. male here as a referral from Dr. Deatra Ina for Trigeminal Neuralgia. PMHx Trigeminal neuralgia and HTN. Facial pain for 5 years. Would occur twice a year in the past but now getting worse and more frequent and has had to have acute management twice this year and had more "flare ups". He had pain from the right facial area radiating to the nose but more recently pain is in the forehead, electric shooting, severe (points to the right supraorbital area). It can last 20-30 seconds repeatedly throughout the day. It can last for days and ongoing. Steroids help and he goes to the pcp. Then he will be fine for a few months and it will get back. In between he is fine no vision changes. It is severe. Unknown triggers, so pressure may trigger but not chewing or brushing however chewing will aggravate it when it is occurring. No known inciting event or trauma. No other focal neurologic deficits, associated symptoms, inciting events or modifiable factors.  Reviewed notes, labs and imaging from outside physicians, which showed:  Reviewed notes, patient was seen by neurology in November 2014 for right sided facial pain over the right cheek radiation into the nose and eyes. Described as tingling. No associated numbness. Symptoms were not exacerbated by chewing, talking or cold air. Light pressure over the right cheek with occasionally worsen his pain. His PCP started him on prednisone and an MRI of the brain was ordered. At the time of appointment his symptoms had completely  resolved. MRI of the brain was unremarkable. Neurologic exam was nonfocal. He was diagnosed with trigeminal neuralgia. No shingles or history of vesicular eruption over the face or rash.  MRI brain: Personally reviewed images and agree with the following IMPRESSION: Scattered small white matter hyperintensities bilaterally. These have a chronic appearance and may be due to chronic microvascular ischemia or migraine headache.  Review of Systems: Patient complains of symptoms per HPI as well as the following symptoms: No rash, no CP. Pertinent negatives and positives per HPI. All others negative.   Social History   Socioeconomic History  . Marital status: Married    Spouse name: Not on file  . Number of children: 3  . Years of education: Not on file  . Highest education level: High school graduate  Occupational History  . Not on file  Social Needs  . Financial resource strain: Not on file  . Food insecurity:    Worry: Not on file    Inability: Not on file  . Transportation needs:    Medical: Not on file    Non-medical: Not on file  Tobacco Use  . Smoking status: Never Smoker  . Smokeless tobacco: Never Used  Substance and Sexual Activity  . Alcohol use: Yes    Comment: Occasionally  . Drug use: No  . Sexual activity: Not on file  Lifestyle  . Physical activity:    Days per week: Not on file    Minutes per session: Not on file  . Stress:  Not on file  Relationships  . Social connections:    Talks on phone: Not on file    Gets together: Not on file    Attends religious service: Not on file    Active member of club or organization: Not on file    Attends meetings of clubs or organizations: Not on file    Relationship status: Not on file  . Intimate partner violence:    Fear of current or ex partner: Not on file    Emotionally abused: Not on file    Physically abused: Not on file    Forced sexual activity: Not on file  Other Topics Concern  . 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   Right handed   Caffeine: 3-4 cups a week     Family History  Problem Relation Age of Onset  . Cancer Mother   . Cancer Father   . Diabetes Sister     Past Medical History:  Diagnosis Date  . Cancer [facility])    prostate cancer; pt reports he's "clean" as of his Nov 2019 check-up  . Hypertension   . Numbness and tingling    in feet bilat comes and goes   . Ruptured lumbar disc    L5 ruptured, L4 deteriorated  . Trauma    great right toe laceration secondary to lawnmower accident   . Trigeminal neuralgia     Past Surgical History:  Procedure Laterality Date  . KNEE ARTHROSCOPY  2006   both knees  . LYMPHADENECTOMY Bilateral 04/20/2015   Procedure: BILATERAL LYMPHADENECTOMY;  Surgeon: Alexis Frock, MD;  Location: [facility];  Service: Urology;  Laterality: Bilateral;  . ROBOT ASSISTED LAPAROSCOPIC RADICAL PROSTATECTOMY N/A 04/20/2015   Procedure: ROBOTIC ASSISTED LAPAROSCOPIC RADICAL PROSTATECTOMY LEVEL 2;  Surgeon: Alexis Frock, MD;  Location: [facility];  Service: Urology;  Laterality: N/A;    Current Outpatient Medications  Medication Sig Dispense Refill  . allopurinol (ZYLOPRIM) 300 MG tablet Take 300 mg by mouth daily.  0  . aspirin EC 81 MG tablet Take 81 mg by mouth daily.    Marland Kitchen BENICAR HCT 40-12.5 MG tablet Take 1 tablet by mouth daily.  1  . gabapentin (NEURONTIN) 100 MG capsule Take 1 capsule (100 mg total) by mouth 3 (three) times daily as needed. 90 capsule 6  . Gabapentin, Once-Daily, (GRALISE STARTER) 300 & 600 MG MISC Take 1,800 mg by mouth daily. Take with dinner. 2 each 0   No current facility-administered medications for this visit.     Allergies as of 05/14/2018  . (No Known Allergies)    Vitals: BP (!) 147/90 (BP Location: Right Arm, Patient Position: Sitting)   Pulse 80   Ht 6' (1.829 m)   Wt 258 lb (117 kg)   BMI 34.99 kg/m  Last  Weight:  Wt Readings from Last 1 Encounters:  05/14/18 258 lb (117 kg)   Last Height:   Ht Readings from Last 1 Encounters:  05/14/18 6' (1.829 m)    Physical exam: Exam: Gen: NAD, conversant, well nourised, obese, well groomed                     CV: RRR, no MRG. No Carotid Bruits. No peripheral edema, warm, nontender Eyes: Conjunctivae clear without exudates or hemorrhage  Neuro: Detailed Neurologic Exam  Speech:    Speech is normal; fluent  and spontaneous with normal comprehension.  Cognition:    The patient is oriented to person, place, and time;     recent and remote memory intact;     language fluent;     normal attention, concentration,     fund of knowledge Cranial Nerves:    The pupils are equal, round, and reactive to light. Attempted funduscopic exam could not visualize. Visual fields are full to finger confrontation. Extraocular movements are intact. Trigeminal sensation is intact and the muscles of mastication are normal. The face is symmetric. The palate elevates in the midline. Hearing intact. Voice is normal. Shoulder shrug is normal. The tongue has normal motion without fasciculations.   Coordination:    Normal finger to nose and heel to shin. Normal rapid alternating movements.   Gait:   Not ataxic, good stride  Motor Observation:    No asymmetry, no atrophy, and no involuntary movements noted. Tone:    Normal muscle tone.    Posture:    Posture is normal. normal erect    Strength:    Strength is V/V in the upper and lower limbs.      Sensation: intact to LT     Reflex Exam:  DTR's:    Deep tendon reflexes in the upper and lower extremities are symmetrical bilaterally.   Toes:    The toes are downgoing bilaterally.   Clonus:    Clonus is absent.    Assessment/Plan:  68 year old with worsening trigeminal neuralgia.  - Recommend MRI brain w trigeminal protocol (declines again today). Discussed if this episode continues we will order this  image to see if there is vascular compression or some other cause. - watch [email or web address].randall.com/, provided this video which is good overview of percutneous procedures that can be considered for TG - Stop Gabapentin IR and start Gabapentin ER. Gave samples of Gralise at higher dose, titration pack and will check labs today. - Other options include Tegretol, Lyrica, Lamictal, baclofen. - Also here can provide Trigeminal Nerve blocks and Supraorbital Nerve Blocks (will think about it)  Orders Placed This Encounter  Procedures  . Sedimentation rate  . C-reactive protein  . Basic Metabolic Panel   Meds ordered this encounter  Medications  . Gabapentin, Once-Daily, (GRALISE STARTER) 300 & 600 MG MISC    Sig: Take 1,800 mg by mouth daily. Take with dinner.    Dispense:  2 each    Refill:  0     To prevent or relieve headaches, try the following: Cool Compress. Lie down and place a cool compress on your head.  Avoid headache triggers. If certain foods or odors seem to have triggered your migraines in the past, avoid them. A headache diary might help you identify triggers.  Include physical activity in your daily routine. Try a daily walk or other moderate aerobic exercise.  Manage stress. Find healthy ways to cope with the stressors, such as delegating tasks on your to-do list.  Practice relaxation techniques. Try deep breathing, yoga, massage and visualization.  Eat regularly. Eating regularly scheduled meals and maintaining a healthy diet might help prevent headaches. Also, drink plenty of fluids.  Follow a regular sleep schedule. Sleep deprivation might contribute to headaches Consider biofeedback. With this mind-body technique, you learn to control certain bodily functions - such as muscle tension, heart rate and blood pressure - to prevent headaches or reduce headache pain.    Proceed to emergency room if you experience new or worsening symptoms or symptoms do  not  resolve, if you have new neurologic symptoms or if headache is severe, or for any concerning symptom.  Cc: Dr. Deatra Ina  A total of 25 minutes was spent face-to-face with this patient. Over half this time was spent on counseling patient on the  1. Trigeminal neuralgia   2. Right temporal headache    diagnosis and different diagnostic and therapeutic options, counseling and coordination of care, risks ans benefits of management, compliance, or risk factor reduction and education.     Sarina Ill, MD  [facility] [address] [place], [place] [address]  Phone [phone] Fax [phone]

## 2018-05-14 NOTE — Patient Instructions (Signed)
Labs today I recommend MRI of the brain in the future Stop Gabapentin(Neurontin) Start Gralise   Gabapentin extended-release tablets (Gralise) What is this medicine? GABAPENTIN (GA ba pen tin) is used to treat certain types of nerve pain. This medicine may be used for other purposes; ask your health care provider or pharmacist if you have questions. COMMON BRAND NAME(S): Gralise What should I tell my health care provider before I take this medicine? They need to know if you have any of these conditions: -kidney disease -seizures -suicidal thoughts, plans, or attempt; a previous suicide attempt by you or a family member -an unusual or allergic reaction to gabapentin, other medicines, foods, dyes, or preservatives -pregnant or trying to get pregnant -breast-feeding How should I use this medicine? Take this medicine by mouth with a glass of water. Follow the directions on the prescription label. Take this medicine with your evening meal. Do not cut, crush, or chew this medicine. Take your medicine at regular intervals. Do not take it more often than directed. Do not stop taking except on your doctor's advice. A special MedGuide will be given to you by the pharmacist with each prescription and refill. Be sure to read this information carefully each time. Talk to your pediatrician regarding the use of this medicine in children. Special care may be needed. Overdosage: If you think you have taken too much of this medicine contact a poison control center or emergency room at once. NOTE: This medicine is only for you. Do not share this medicine with others. What if I miss a dose? If you miss a dose, take it as soon as you can. If it is almost time for your next dose, take only that dose. Do not take double or extra doses. What may interact with this medicine? Do not take this medicine with any of the following medications: -other gabapentin products (Horizant, Neurontin) This medicine may also  interact with the following medications: -alcohol -antacids -antihistamines for allergy, cough and cold -certain medicines for anxiety or sleep -certain medicines for depression or psychotic disturbances -homatropine; hydrocodone -naproxen -narcotic medicines (opiates) for pain -phenothiazines like chlorpromazine, mesoridazine, prochlorperazine, thioridazine This list may not describe all possible interactions. Give your health care provider a list of all the medicines, herbs, non-prescription drugs, or dietary supplements you use. Also tell them if you smoke, drink alcohol, or use illegal drugs. Some items may interact with your medicine. What should I watch for while using this medicine? Tell your doctor or healthcare professional if your symptoms do not start to get better or if they get worse. Do not stop taking except on your doctor's advice. You may develop a severe reaction. Your doctor will tell you how much medicine to take. You may get drowsy or dizzy. Do not drive, use machinery, or do anything that needs mental alertness until you know how this medicine affects you. Do not stand or sit up quickly, especially if you are an older patient. This reduces the risk of dizzy or fainting spells. Alcohol may interfere with the effect of this medicine. Avoid alcoholic drinks. The use of this medicine may increase the chance of suicidal thoughts or actions. Pay special attention to how you are responding while on this medicine. Any worsening of mood, or thoughts of suicide or dying should be reported to your health care professional right away. Your mouth may get dry. Chewing sugarless gum or sucking hard candy, and drinking plenty of water may help. Contact your doctor if the problem  does not go away or is severe. What side effects may I notice from receiving this medicine? Side effects that you should report to your doctor or health care professional as soon as possible: -allergic reactions like  skin rash, itching or hives, swelling of the face, lips, or tongue -suicidal thoughts or other mood changes Side effects that usually do not require medical attention (report to your doctor or health care professional if they continue or are bothersome): -confusion -diarrhea -dizziness -dry mouth -loss of balance or coordination -nausea -tiredness -tremors -weight gain This list may not describe all possible side effects. Call your doctor for medical advice about side effects. You may report side effects to FDA at 1-800-FDA-1088. Where should I keep my medicine? Keep out of the reach of children. This medicine may cause accidental overdose and death if it taken by other adults, children, or pets. Mix any unused medicine with a substance like cat litter or coffee grounds. Then throw the medicine away in a sealed container like a sealed bag or a coffee can with a lid. Do not use the medicine after the expiration date. Store at room temperature between 15 and 30 degrees C (59 and 86 degrees F). NOTE: This sheet is a summary. It may not cover all possible information. If you have questions about this medicine, talk to your doctor, pharmacist, or health care provider.  2019 Elsevier/Gold Standard (2015-10-07 10:14:51)

## 2018-05-15 LAB — BASIC METABOLIC PANEL
BUN/Creatinine Ratio: 16 (ref 10–24)
BUN: 15 mg/dL (ref 8–27)
CALCIUM: 9.8 mg/dL (ref 8.6–10.2)
CO2: 24 mmol/L (ref 20–29)
Chloride: 102 mmol/L (ref 96–106)
Creatinine, Ser: 0.91 mg/dL (ref 0.76–1.27)
GFR, EST AFRICAN AMERICAN: 102 mL/min/{1.73_m2} (ref >59)
GFR, EST NON AFRICAN AMERICAN: 88 mL/min/{1.73_m2} (ref >59)
Glucose: 91 mg/dL (ref 65–99)
Potassium: 4.3 mmol/L (ref 3.5–5.2)
Sodium: 142 mmol/L (ref 134–144)

## 2018-05-15 LAB — C-REACTIVE PROTEIN: CRP: 3 mg/L (ref 0–10)

## 2018-05-15 LAB — SEDIMENTATION RATE: Sed Rate: 11 mm/hr (ref 0–30)

## 2018-05-16 ENCOUNTER — Ambulatory Visit: Payer: Self-pay | Admitting: Neurology

## 2018-05-28 DIAGNOSIS — M545 Low back pain: Secondary | ICD-10-CM | POA: Diagnosis not present

## 2018-05-28 DIAGNOSIS — M48061 Spinal stenosis, lumbar region without neurogenic claudication: Secondary | ICD-10-CM | POA: Diagnosis not present

## 2018-10-27 ENCOUNTER — Telehealth: Payer: Self-pay | Admitting: *Deleted

## 2018-10-27 ENCOUNTER — Encounter: Payer: Self-pay | Admitting: *Deleted

## 2018-10-27 NOTE — Telephone Encounter (Signed)
Spoke with pt's daughter Eugene Garnet (on [place]). She provided an updated work number for pt which I updated in chart. Also updated email address per daughter's request. Pt will help pt with mychart VV. She understands once pt logs into the account they will be able to see pt's VV listed under visits on app. She was also advised the app will need to be downloaded to the device for best performance. I called the pt.  Pt understands we will take all precautions to reduce any security or privacy concerns.  Pt understands that this will be treated like an in office visit and we will file with pt's insurance, and there may be a patient responsible charge related to this service. 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 Pt also confirmed DOB and provided updates to chart. I encouraged him to try to log into mychart sometime before the morning of the visit. He verbalized understanding and appreciation for the call.

## 2018-10-29 ENCOUNTER — Encounter: Payer: Self-pay | Admitting: Neurology

## 2018-10-29 ENCOUNTER — Telehealth (INDEPENDENT_AMBULATORY_CARE_PROVIDER_SITE_OTHER): Payer: Medicare Other | Admitting: Neurology

## 2018-10-29 DIAGNOSIS — R200 Anesthesia of skin: Secondary | ICD-10-CM | POA: Diagnosis not present

## 2018-10-29 DIAGNOSIS — R519 Headache, unspecified: Secondary | ICD-10-CM

## 2018-10-29 DIAGNOSIS — R51 Headache: Secondary | ICD-10-CM

## 2018-10-29 DIAGNOSIS — G500 Trigeminal neuralgia: Secondary | ICD-10-CM | POA: Diagnosis not present

## 2018-10-29 DIAGNOSIS — H5711 Ocular pain, right eye: Secondary | ICD-10-CM | POA: Diagnosis not present

## 2018-10-29 MED ORDER — PREDNISONE 20 MG PO TABS: ORAL_TABLET | ORAL | 3 refills | Status: DC

## 2018-10-29 NOTE — Progress Notes (Signed)
[facility]    Provider:  Dr Jaynee Eagles Referring Provider: Aletha Halim., PA-C Primary Care Physician:  Aletha Halim., PA-C  CC:  Trigeminal Neuralgia  Virtual Visit via Video Note  I connected with Jerry Hudson on 10/29/18 at  8:30 AM EDT by a video enabled telemedicine application and verified that I am speaking with the correct person using two identifiers.  Location: Patient: home Provider: office   I discussed the limitations of evaluation and management by telemedicine and the availability of in person appointments. The patient expressed understanding and agreed to proceed.  Assessment and Plan:   Follow Up Instructions:    I discussed the assessment and treatment plan with the patient. The patient was provided an opportunity to ask questions and all were answered. The patient agreed with the plan and demonstrated an understanding of the instructions.   The patient was advised to call back or seek an in-person evaluation if the symptoms worsen or if the condition fails to improve as anticipated.  I provided 25 minutes of non-face-to-face time during this encounter.   Melvenia Beam, MD    10/29/2018: Patient's last flareup was in January and a steroid taper appeared to help him for many months.  He says that the pain just started coming back a few weeks ago.  We discussed percutaneous procedures I had sent him a video several months ago he said he watch but it was a long time ago.  I did convince him to have an MRI of the brain with trigeminal protocol to evaluate for vascular compression or brainstem stroke or other issues with the trigeminal nerve.  In the meantime we will start another taper, last time it took several Medrol Dosepaks so instead of doing that I will start him out very high-dose with prednisone 80 mg and taper over 2 weeks which should help him at least until we get the MRI of the trigeminal nerve.  Discussed side effects of  steroids.  Patient understands and agrees will call him after MRI for further evaluation and work-up.  05/14/2018: Last seen several years ago and he has had a flare up now since the end of October. Tried steroids for 9 days and didn't help. Just started gabapentin. Used to be a shooting electric pain now is more constant around his eye and the mouth.  MRi of the trigeminal nerve   HPI:  Jerry Hudson is a 68 y.o. male here as a referral from Dr. Deatra Ina for Trigeminal Neuralgia. PMHx Trigeminal neuralgia and HTN. Facial pain for 5 years. Would occur twice a year in the past but now getting worse and more frequent and has had to have acute management twice this year and had more "flare ups". He had pain from the right facial area radiating to the nose but more recently pain is in the forehead, electric shooting, severe (points to the right supraorbital area). It can last 20-30 seconds repeatedly throughout the day. It can last for days and ongoing. Steroids help and he goes to the pcp. Then he will be fine for a few months and it will get back. In between he is fine no vision changes. It is severe. Unknown triggers, so pressure may trigger but not chewing or brushing however chewing will aggravate it when it is occurring. No known inciting event or trauma. No other focal neurologic deficits, associated symptoms, inciting events or modifiable factors.  Reviewed notes, labs and imaging from outside physicians, which showed:  Reviewed notes, patient was seen by neurology in November 2014 for right sided facial pain over the right cheek radiation into the nose and eyes. Described as tingling. No associated numbness. Symptoms were not exacerbated by chewing, talking or cold air. Light pressure over the right cheek with occasionally worsen his pain. His PCP started him on prednisone and an MRI of the brain was ordered. At the time of appointment his symptoms had completely resolved. MRI of the brain was  unremarkable. Neurologic exam was nonfocal. He was diagnosed with trigeminal neuralgia. No shingles or history of vesicular eruption over the face or rash.  MRI brain: Personally reviewed images and agree with the following IMPRESSION: Scattered small white matter hyperintensities bilaterally. These have a chronic appearance and may be due to chronic microvascular ischemia or migraine headache.  Review of Systems: Patient complains of symptoms per HPI as well as the following symptoms: No rash, no CP. Pertinent negatives and positives per HPI. All others negative.   Social History   Socioeconomic History   Marital status: Married    Spouse name: Not on file   Number of children: 3   Years of education: Not on file   Highest education level: High school graduate  Occupational History   Not on file  Social Needs   Financial resource strain: Not on file   Food insecurity    Worry: Not on file    Inability: Not on file   Transportation needs    Medical: Not on file    Non-medical: Not on file  Tobacco Use   Smoking status: Never Smoker   Smokeless tobacco: Never Used  Substance and Sexual Activity   Alcohol use: Yes    Comment: Occasionally   Drug use: No   Sexual activity: Not on file  Lifestyle   Physical activity    Days per week: Not on file    Minutes per session: Not on file   Stress: Not on file  Relationships   Social connections    Talks on phone: Not on file    Gets together: Not on file    Attends religious service: Not on file    Active member of club or organization: Not on file    Attends meetings of clubs or organizations: Not on file    Relationship status: Not on file   Intimate partner violence    Fear of current or ex partner: Not on file    Emotionally abused: Not on file    Physically abused: Not on file    Forced sexual activity: Not on file  Other Topics Concern   Not on file  Social History Narrative   Currently, 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   Right handed   Caffeine: maybe 3-4 cups a week     Family History  Problem Relation Age of Onset   Cancer Mother    Cancer Father    Diabetes Sister     Past Medical History:  Diagnosis Date   Cancer [facility])    prostate cancer; pt reports he's "clean" as of his Nov 2019 check-up   Hypertension    Numbness and tingling    in feet bilat comes and goes    Ruptured lumbar disc    L5 ruptured, L4 deteriorated   Trauma    great right toe laceration secondary to lawnmower accident    Trigeminal neuralgia     Past Surgical  History:  Procedure Laterality Date   KNEE ARTHROSCOPY  2006   both knees   LYMPHADENECTOMY Bilateral 04/20/2015   Procedure: BILATERAL LYMPHADENECTOMY;  Surgeon: Alexis Frock, MD;  Location: [facility];  Service: Urology;  Laterality: Bilateral;   ROBOT ASSISTED LAPAROSCOPIC RADICAL PROSTATECTOMY N/A 04/20/2015   Procedure: ROBOTIC ASSISTED LAPAROSCOPIC RADICAL PROSTATECTOMY LEVEL 2;  Surgeon: Alexis Frock, MD;  Location: [facility];  Service: Urology;  Laterality: N/A;    Current Outpatient Medications  Medication Sig Dispense Refill   allopurinol (ZYLOPRIM) 300 MG tablet Take 300 mg by mouth daily.  0   aspirin EC 81 MG tablet Take 81 mg by mouth daily.     BENICAR HCT 40-12.5 MG tablet Take 1 tablet by mouth daily.  1   gabapentin (NEURONTIN) 100 MG capsule Take 1 capsule (100 mg total) by mouth 3 (three) times daily as needed. 90 capsule 6   hydrochlorothiazide (HYDRODIURIL) 12.5 MG tablet Take 12.5 mg by mouth daily.      predniSONE (DELTASONE) 20 MG tablet Start with 4 pills in the morning with food (80 mg) for 4 days.  Then decrease to 3 pills in the morning (60 mg) for 3 days.  Then decrease to 2 pills in the morning (40 mg) for 3 days.  Then decrease to 1 pill (20 mg) for 3 days.  Then stop. 34 tablet 3   No current facility-administered medications for this  visit.     Allergies as of 10/29/2018   (No Known Allergies)    Vitals: There were no vitals taken for this visit. Last Weight:  Wt Readings from Last 1 Encounters:  05/14/18 258 lb (117 kg)   Last Height:   Ht Readings from Last 1 Encounters:  05/14/18 6' (1.829 m)    Physical exam: Exam: Gen: NAD, conversant, well nourised, obese, well groomed                     CV: RRR, no MRG. No Carotid Bruits. No peripheral edema, warm, nontender Eyes: Conjunctivae clear without exudates or hemorrhage  Neuro: Detailed Neurologic Exam  Speech:    Speech is normal; fluent and spontaneous with normal comprehension.  Cognition:    The patient is oriented to person, place, and time;     recent and remote memory intact;     language fluent;     normal attention, concentration,     fund of knowledge Cranial Nerves:    The pupils are equal, round, and reactive to light. Attempted funduscopic exam could not visualize. Visual fields are full to finger confrontation. Extraocular movements are intact. Trigeminal sensation is intact and the muscles of mastication are normal. The face is symmetric. The palate elevates in the midline. Hearing intact. Voice is normal. Shoulder shrug is normal. The tongue has normal motion without fasciculations.   Coordination:    Normal finger to nose and heel to shin. Normal rapid alternating movements.   Gait:   Not ataxic, good stride  Motor Observation:    No asymmetry, no atrophy, and no involuntary movements noted. Tone:    Normal muscle tone.    Posture:    Posture is normal. normal erect    Strength:    Strength is V/V in the upper and lower limbs.      Sensation: intact to LT     Reflex Exam:  DTR's:    Deep tendon reflexes in the upper and lower extremities are symmetrical bilaterally.   Toes:  The toes are downgoing bilaterally.   Clonus:    Clonus is absent.    Assessment/Plan:  68 year old with worsening trigeminal  neuralgia.  - Recommend MRI brain w trigeminal protocol looking for vascular compression or brainstem stroke or infiltrative lesion. - watch http://www.randall.com/, provided this video which is good overview of percutneous procedures that can be considered for TG.  I mention that again to him today. - Stop Gabapentin IR and start Gabapentin ER. Gave samples of Gralise at higher dose, titration pack and will check labs today.  He stopped taking these, the prednisone taper prior helped him and he only recently had another flareup.  He had to take 2 Medrol Dosepaks however before so this time we will start him on higher dose prednisone and taper over 2 weeks. - Other options include Tegretol, Lyrica, Lamictal, baclofen. - Also here can provide Trigeminal Nerve blocks and Supraorbital Nerve Blocks (will think about it) in the future if needed.  Orders Placed This Encounter  Procedures   MR FACE/TRIGEMINAL WO/W CM   Meds ordered this encounter  Medications   predniSONE (DELTASONE) 20 MG tablet    Sig: Start with 4 pills in the morning with food (80 mg) for 4 days.  Then decrease to 3 pills in the morning (60 mg) for 3 days.  Then decrease to 2 pills in the morning (40 mg) for 3 days.  Then decrease to 1 pill (20 mg) for 3 days.  Then stop.    Dispense:  34 tablet    Refill:  3     To prevent or relieve headaches, try the following: Cool Compress. Lie down and place a cool compress on your head.  Avoid headache triggers. If certain foods or odors seem to have triggered your migraines in the past, avoid them. A headache diary might help you identify triggers.  Include physical activity in your daily routine. Try a daily walk or other moderate aerobic exercise.  Manage stress. Find healthy ways to cope with the stressors, such as delegating tasks on your to-do list.  Practice relaxation techniques. Try deep breathing, yoga, massage and visualization.  Eat regularly. Eating  regularly scheduled meals and maintaining a healthy diet might help prevent headaches. Also, drink plenty of fluids.  Follow a regular sleep schedule. Sleep deprivation might contribute to headaches Consider biofeedback. With this mind-body technique, you learn to control certain bodily functions -- such as muscle tension, heart rate and blood pressure -- to prevent headaches or reduce headache pain.    Proceed to emergency room if you experience new or worsening symptoms or symptoms do not resolve, if you have new neurologic symptoms or if headache is severe, or for any concerning symptom.  Cc: Dr. Deatra Ina  A total of 25 minutes was spent face-to-face with this patient. Over half this time was spent on counseling patient on the  1. Trigeminal neuralgia   2. Facial pain   3. Pain of right eye   4. Right facial numbness    diagnosis and different diagnostic and therapeutic options, counseling and coordination of care, risks ans benefits of management, compliance, or risk factor reduction and education.    Cc: Reather Laurence, MD  [facility] [address] [place], [place] [address]  Phone [phone] Fax [phone]

## 2018-10-30 ENCOUNTER — Telehealth: Payer: Self-pay | Admitting: Neurology

## 2018-10-30 NOTE — Telephone Encounter (Signed)
Done, thanks

## 2018-10-30 NOTE — Telephone Encounter (Signed)
When you get a chance can you switch the MRI to a MRI Brain w/wo contrast and on the order notes put attn: Trigeminal Neuralgia. Also use that as the diagnose code because the insurance will approve a MRI Brain w/wo contrast but not a MRI Face Trigeminal.

## 2018-10-30 NOTE — Telephone Encounter (Signed)
[employer] medicare Josem Kaufmann: [id number] (exp. 10/30/18 to 04/27/19) order sent to GI. They will reach out to the patient to schedule.

## 2018-10-30 NOTE — Addendum Note (Signed)
Addended by: Sarina Ill B on: 10/30/2018 12:48 PM   Modules accepted: Orders

## 2018-11-19 DIAGNOSIS — M150 Primary generalized (osteo)arthritis: Secondary | ICD-10-CM | POA: Diagnosis not present

## 2018-11-19 DIAGNOSIS — Z6837 Body mass index (BMI) 37.0-37.9, adult: Secondary | ICD-10-CM | POA: Diagnosis not present

## 2018-11-19 DIAGNOSIS — M1A09X0 Idiopathic chronic gout, multiple sites, without tophus (tophi): Secondary | ICD-10-CM | POA: Diagnosis not present

## 2018-11-19 DIAGNOSIS — E669 Obesity, unspecified: Secondary | ICD-10-CM | POA: Diagnosis not present

## 2018-11-25 ENCOUNTER — Ambulatory Visit
Admission: RE | Admit: 2018-11-25 | Discharge: 2018-11-25 | Disposition: A | Discharge status: Home / Self Care | Payer: Medicare Other | Source: Ambulatory Visit | Attending: Neurology | Admitting: Neurology

## 2018-11-25 ENCOUNTER — Other Ambulatory Visit: Payer: Self-pay

## 2018-11-25 DIAGNOSIS — G500 Trigeminal neuralgia: Secondary | ICD-10-CM

## 2018-11-25 DIAGNOSIS — R519 Headache, unspecified: Secondary | ICD-10-CM

## 2018-11-25 DIAGNOSIS — H5711 Ocular pain, right eye: Secondary | ICD-10-CM

## 2018-11-25 DIAGNOSIS — R200 Anesthesia of skin: Secondary | ICD-10-CM

## 2018-11-25 DIAGNOSIS — R51 Headache: Secondary | ICD-10-CM | POA: Diagnosis not present

## 2018-11-25 MED ORDER — GADOBENATE DIMEGLUMINE 529 MG/ML IV SOLN
20.0000 mL | Freq: Once | INTRAVENOUS | Status: AC | PRN
  Administered 2018-11-25: 20 mL via INTRAVENOUS

## 2019-01-25 IMAGING — MR MR LUMBAR SPINE W/O CM
4 of 5 series · 25 of 48 positions shown · IV contrast (15ml Multihance)
Comparison: Report from study of 04/18/2014. Those images are no
longer available.

CLINICAL DATA: Low back pain radiating to the right groin and right
leg with numbness in the right foot. Symptoms began about 3 weeks
ago.

EXAM:
MRI LUMBAR SPINE WITHOUT CONTRAST
TECHNIQUE: Multiplanar, multisequence MR imaging of the lumbar spine was
performed. No intravenous contrast was administered.

[Series 3: T2 · sagittal · 4.0mm · 0.59mm/px · 6 of 17 slices shown (1 of 2)]
[im 1/17]
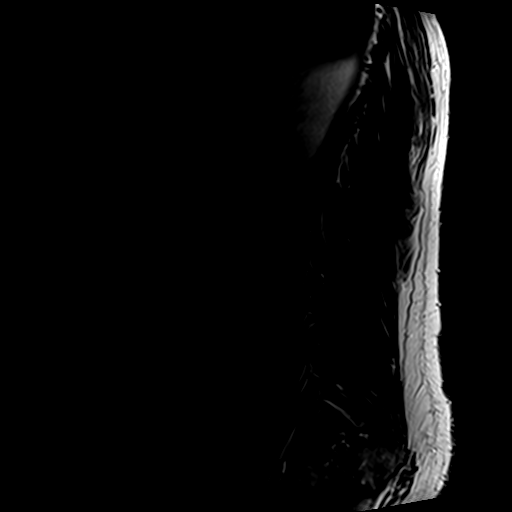
[im 4/17]
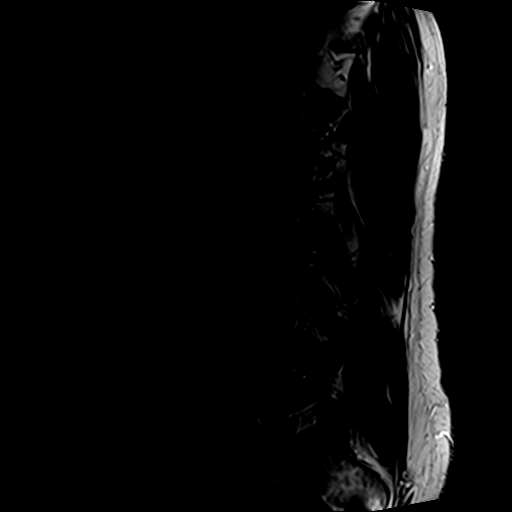
[im 7/17]
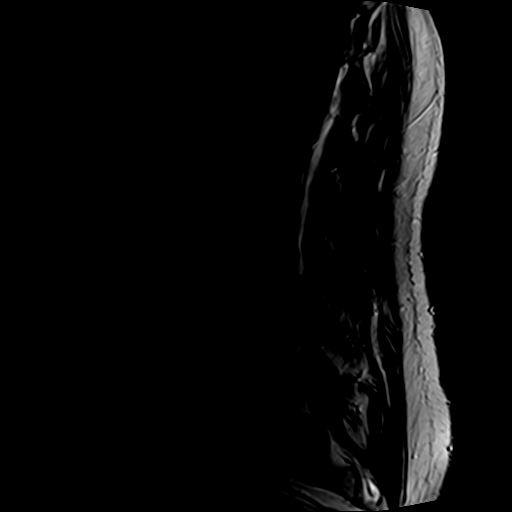
[im 10/17]
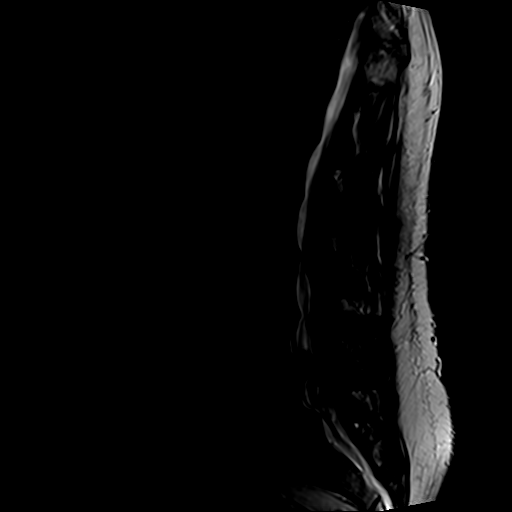
[im 13/17]
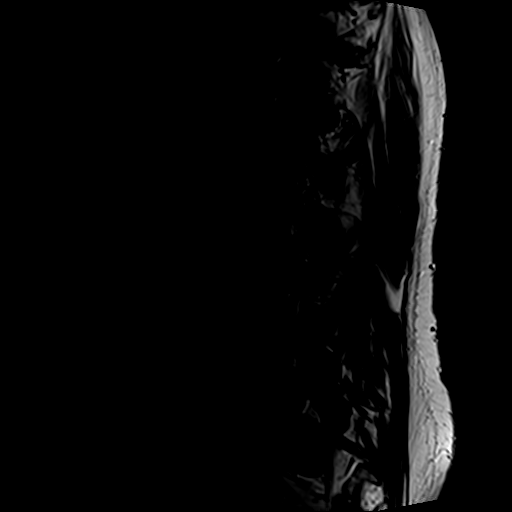
[im 17/17]
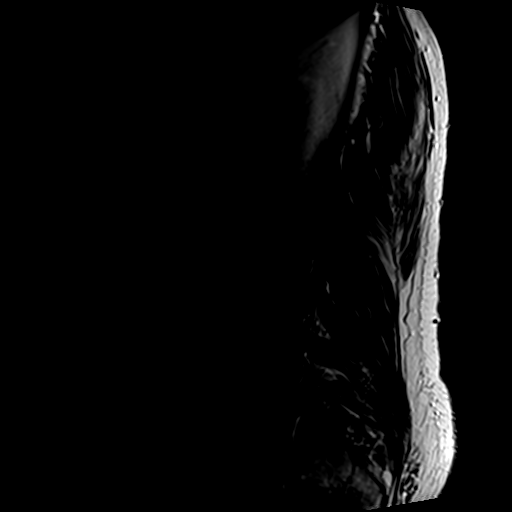

[Series 5: T1 · sagittal · 4.0mm · 0.59mm/px · 5 of 17 slices shown (1 of 2)]
[im 1/17]
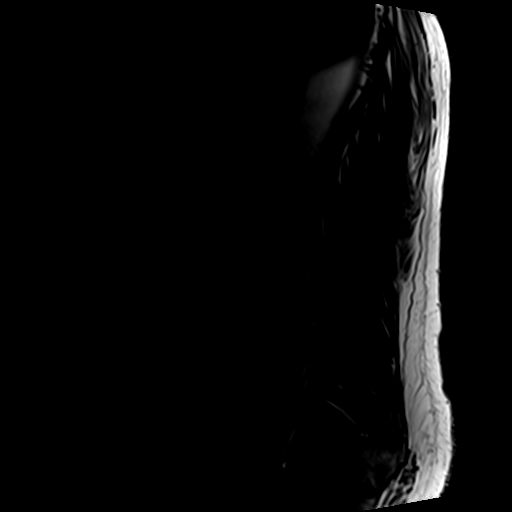
[im 5/17]
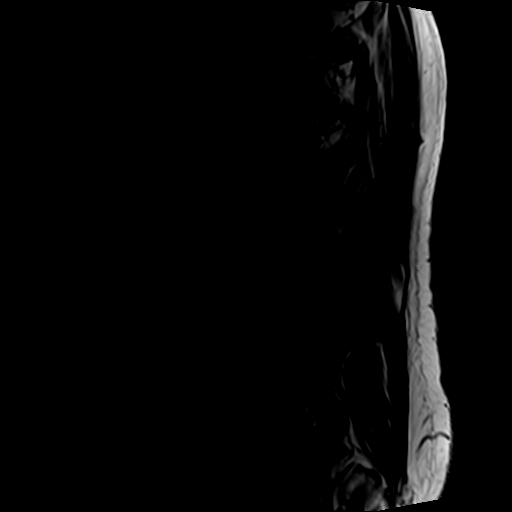
[im 9/17]
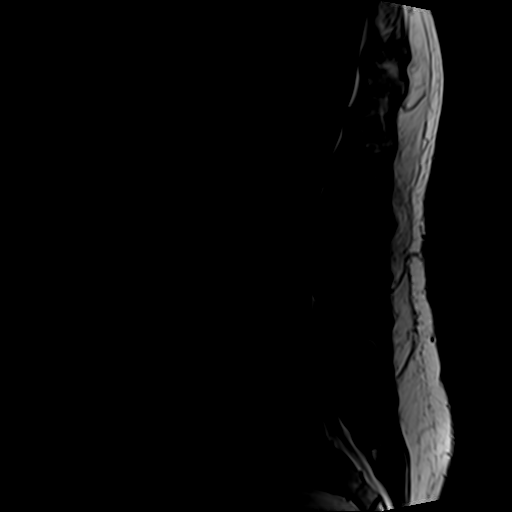
[im 13/17]
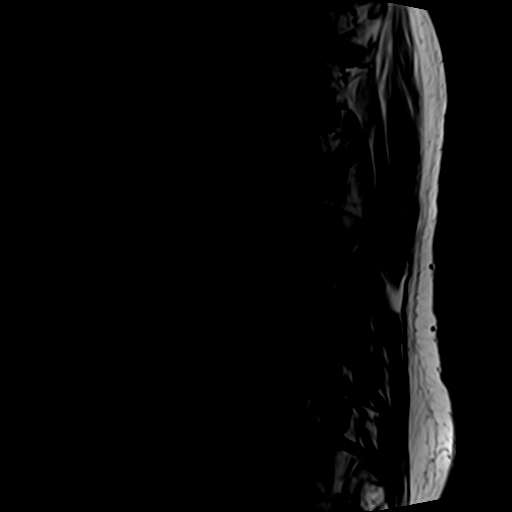
[im 17/17]
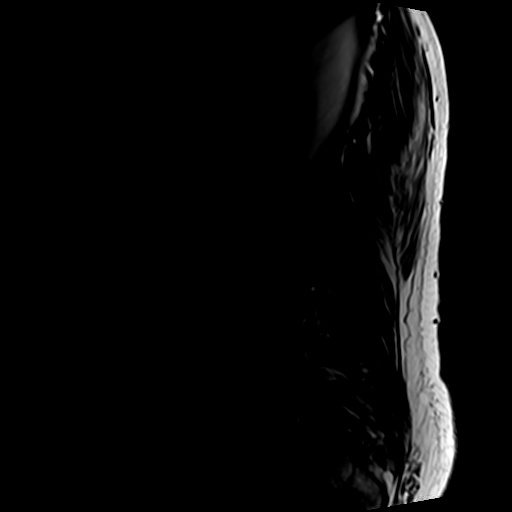

[Series 6: T2 · axial · 4.0mm · 0.70mm/px · z∈[-128,+156]mm · 10 of 55 slices shown (2 of 2)]
[im 4/55]
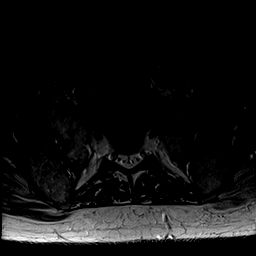
[im 8/55]
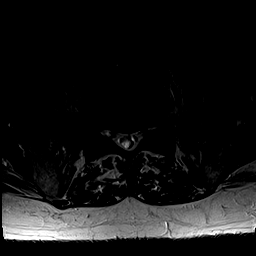
[im 11/55]
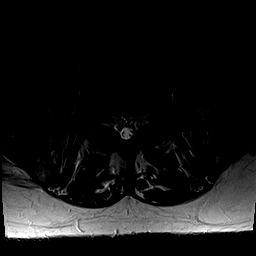
[im 19/55]
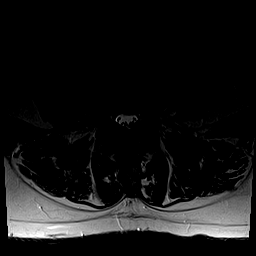
[im 26/55]
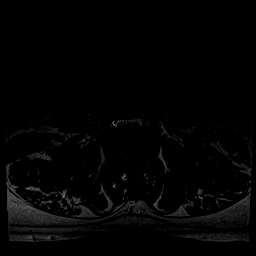
[im 29/55]
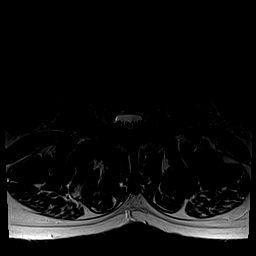
[im 33/55]
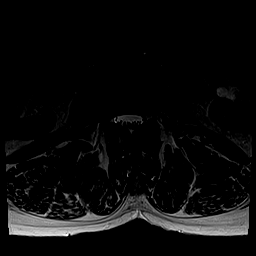
[im 40/55]
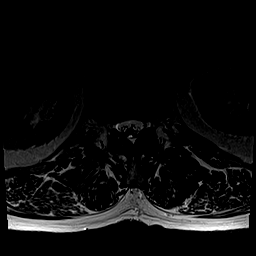
[im 47/55]
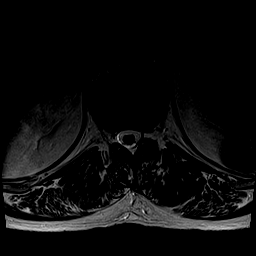
[im 55/55]
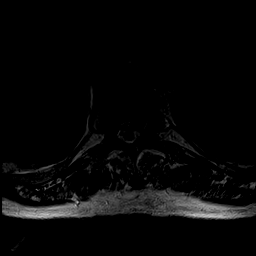

[Series 7: T1 · axial · 4.0mm · 0.35mm/px · z∈[-128,+115]mm · 4 of 55 slices shown (2 of 2)]
[im 4/55]
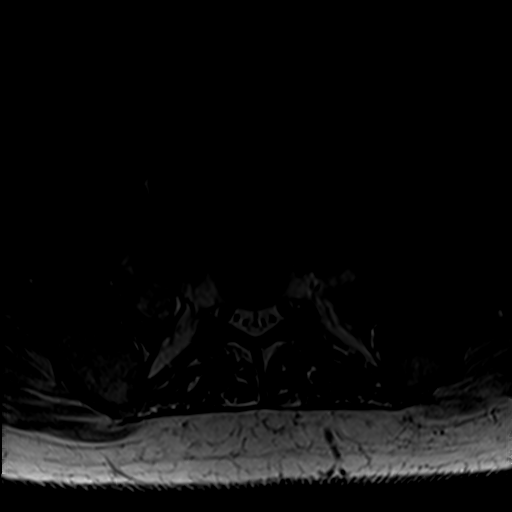
[im 8/55]
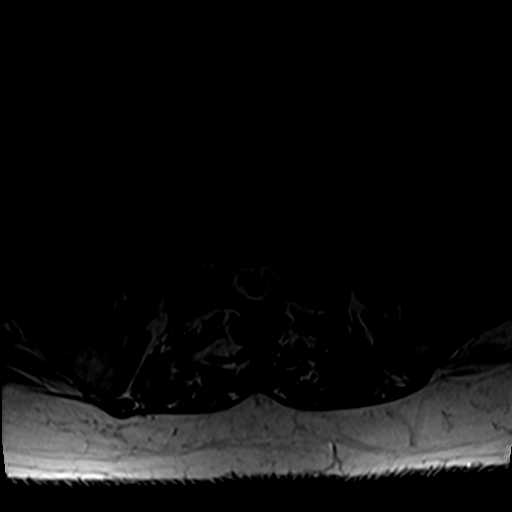
[im 29/55]
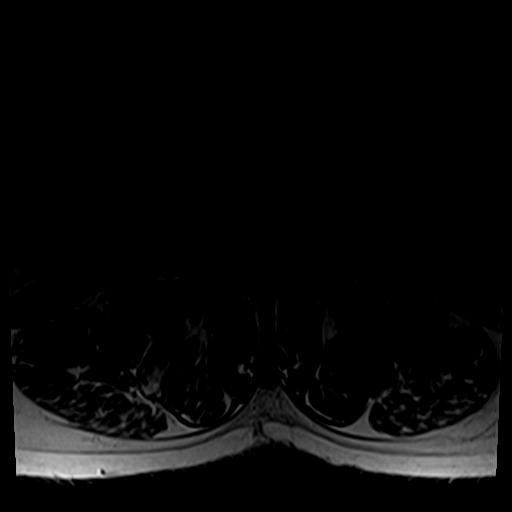
[im 47/55]
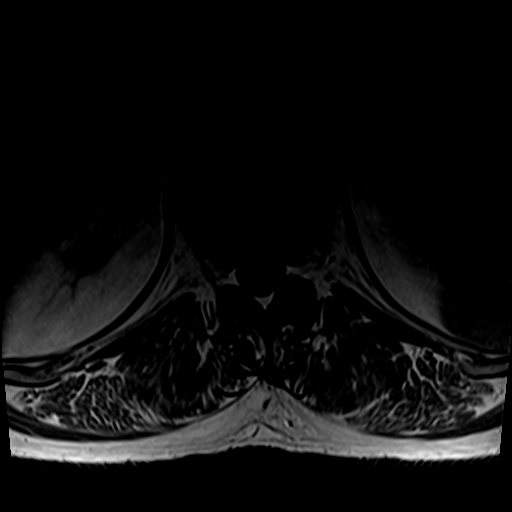

[25 of 48 positions shown; findings below may reference images not displayed]

FINDINGS: Segmentation:  5 lumbar type vertebral bodies presumed.

Alignment: 2 mm anterolisthesis L2-3. 1 mm anterolisthesis L4-5. 1
mm anterolisthesis L5-S1.

Vertebrae:  No fracture or primary bone lesion.

Conus medullaris and cauda equina: Conus extends to the L1 level.
Conus and cauda equina appear normal.

Paraspinal and other soft tissues: Negative

Disc levels:

T12-L1: Mild bulging of the disc. No stenosis or neural compression.

L1-2: Normal disc morphology. Mild facet and ligamentous
hypertrophy. No stenosis.

L2-3: Facet degeneration with facet and ligamentous hypertrophy and
2 mm of anterolisthesis. Desiccation and mild bulging of the disc.
Mild multifactorial stenosis of the canal, but without visible
neural compression.

L3-4: Facet and ligamentous hypertrophy more on the left. Bulging of
the disc more towards the left. Stenosis of the canal, with
potential for neural compression in the left lateral recess.

L4-5: Facet degeneration with facet and ligamentous hypertrophy.
Some edema of the facet joints. Synovial cyst projecting inward from
the facet on the right. Disc herniation slightly more prominent
towards the right. Severe stenosis at this level, particularly on
the right. Neural compression could occur on either side.

L5-S1: Mild facet degeneration. Mild bulging of the disc. Mild
narrowing of the subarticular lateral recesses without visible
neural compression.
IMPRESSION: Severe multifactorial spinal stenosis at L4-5 likely to cause neural
compression. Contributing factors include bilateral facet
degeneration and hypertrophy. There is facet joint edema. There is a
small synovial cyst projecting inward from the facet on the right.
There is broad-based disc herniation more prominent towards the
right.

L3-4 shows spinal stenosis most pronounced in the left lateral
recess to do slight asymmetric facet hypertrophy and bulging of the
disc more prominent towards the left. Neural compression could
occur, particularly on the left.

L2-3 shows mild multifactorial spinal stenosis but no distinct
neural compression.

L5-S1 shows mild stenosis of the subarticular lateral recesses but
no distinct neural compression.

## 2019-02-12 DIAGNOSIS — Z23 Encounter for immunization: Secondary | ICD-10-CM | POA: Diagnosis not present

## 2019-02-12 DIAGNOSIS — G500 Trigeminal neuralgia: Secondary | ICD-10-CM | POA: Diagnosis not present

## 2019-02-12 DIAGNOSIS — E785 Hyperlipidemia, unspecified: Secondary | ICD-10-CM | POA: Diagnosis not present

## 2019-02-12 DIAGNOSIS — I10 Essential (primary) hypertension: Secondary | ICD-10-CM | POA: Diagnosis not present

## 2019-02-12 DIAGNOSIS — R739 Hyperglycemia, unspecified: Secondary | ICD-10-CM | POA: Diagnosis not present

## 2019-02-20 DIAGNOSIS — H5213 Myopia, bilateral: Secondary | ICD-10-CM | POA: Diagnosis not present

## 2019-07-13 ENCOUNTER — Telehealth: Payer: Self-pay | Admitting: Neurology

## 2019-07-13 NOTE — Telephone Encounter (Signed)
Pt has called because of his right eye pain, pt is asking if Dr Jaynee Eagles will call in something for his pain, please send to [place] [phone]

## 2019-07-13 NOTE — Telephone Encounter (Signed)
Spoke with pt. He had a flare-up awhile back that lasted 2-3 weeks. Currently another flare-up, lasting 2 weeks so far. He reports eye pain as prior. Last seen June 2020, had been given prednisone taper. I scheduled the pt for an appointment with Amy NP this Wed 3/24 @ 1:00 pm arrival 15-30 minutes early. Pt verbalized appreciation.

## 2019-07-15 ENCOUNTER — Encounter: Payer: Self-pay | Admitting: Family Medicine

## 2019-07-15 ENCOUNTER — Ambulatory Visit: Payer: Medicare Other | Admitting: Family Medicine

## 2019-07-15 ENCOUNTER — Other Ambulatory Visit: Payer: Self-pay

## 2019-07-15 VITALS — BP 138/88 | HR 73 | Temp 97.3°F | Ht 73.0 in | Wt 260.0 lb

## 2019-07-15 DIAGNOSIS — G500 Trigeminal neuralgia: Secondary | ICD-10-CM | POA: Diagnosis not present

## 2019-07-15 MED ORDER — OXCARBAZEPINE 300 MG PO TABS: 300.0000 mg | ORAL_TABLET | Freq: Two times a day (BID) | ORAL | 3 refills | Status: DC

## 2019-07-15 NOTE — Patient Instructions (Addendum)
We will start oxcarbazepine 300mg  twice daily   Continue gabapentin 100mg  three times daily. May increase this dose in the future if needed   Please call with any worsening or concerns   Follow up in 4 weeks if no improvement, 1 year if symptoms improve     Trigeminal Neuralgia  Trigeminal neuralgia is a nerve disorder that causes severe pain on one side of the face. The pain may last from a few seconds to several minutes. The pain is usually only on one side of the face. Symptoms may occur for days, weeks, or months and then go away for months or years. The pain may return and be worse than before. What are the causes? This condition is caused by damage or pressure to a nerve in the head that is called the trigeminal nerve. An attack can be triggered by:  Talking.  Chewing.  Putting on makeup.  Washing your face.  Shaving your face.  Brushing your teeth.  Touching your face. What increases the risk? You are more likely to develop this condition if you:  Are 60 years of age or older.  Are male. What are the signs or symptoms? The main symptom of this condition is severe pain in the:  Jaw.  Lips.  Eyes.  Nose.  Scalp.  Forehead.  Face. The pain may be:  Intense.  Stabbing.  Electric.  Shock-like. How is this diagnosed? This condition is diagnosed with a physical exam. A CT scan or an MRI may be done to rule out other conditions that can cause facial pain. How is this treated? This condition may be treated with:  Avoiding the things that trigger your symptoms.  Taking prescription medicines (anticonvulsants).  Having surgery. This may be done in severe cases if other medical treatment does not provide relief.  Having procedures such as ablation, thermal, or radiation therapy. It may take up to one month for treatment to start relieving the pain. Follow these instructions at home: Managing pain  Learn as much as you can about how to manage  your pain. Ask your health care provider if a pain specialist would be helpful.  Consider talking with a mental health care provider (psychologist) about how to cope with the pain.  Consider joining a pain support group. General instructions  Take over-the-counter and prescription medicines only as told by your health care provider.  Avoid the things that trigger your symptoms. It may help to: ? Chew on the unaffected side of your mouth. ? Avoid touching your face. ? Avoid blasts of hot or cold air.  Follow your treatment plan as told by your health care provider. This may include: ? Cognitive or behavioral therapy. ? Gentle, regular exercise. ? Meditation or yoga. ? Aromatherapy.  Keep all follow-up visits as told by your health care provider. You may need to be monitored closely to make sure treatment is working well for you. Where to find more information  Facial Pain Association: fpa-support.org Contact a health care provider if:  Your medicine is not helping your symptoms.  You have side effects from the medicine used for treatment.  You develop new, unexplained symptoms, such as: ? Double vision. ? Facial weakness. ? Facial numbness. ? Changes in hearing or balance.  You feel depressed. Get help right away if:  Your pain is severe and is not getting better.  You develop suicidal thoughts. If you ever feel like you may hurt yourself or others, or have thoughts about taking your  own life, get help right away. You can go to your nearest emergency department or call:  Your local emergency services (911 in the [place]  A suicide crisis helpline, such as the [place] at 252 623 5953. This is open 24 hours a day. Summary  Trigeminal neuralgia is a nerve disorder that causes severe pain on one side of the face. The pain may last from a few seconds to several minutes.  This condition is caused by damage or pressure to a nerve in the head  that is called the trigeminal nerve.  Treatment may include avoiding the things that trigger your symptoms, taking medicines, or having surgery or procedures. It may take up to one month for treatment to start relieving the pain.  Avoid the things that trigger your symptoms.  Keep all follow-up visits as told by your health care provider. You may need to be monitored closely to make sure treatment is working well for you. This information is not intended to replace advice given to you by your health care provider. Make sure you discuss any questions you have with your health care provider. Document Revised: 03/10/2018 Document Reviewed: 03/10/2018 Elsevier Patient Education  [place].

## 2019-07-15 NOTE — Progress Notes (Addendum)
PATIENT: Jerry Hudson DOB: Sep 18, 1950  REASON FOR VISIT: follow up HISTORY FROM: patient  Chief Complaint  Patient presents with  . Follow-up    RM1. alone. States that neuralgia keeps flaring up. Sessions of the flare ups vary.     HISTORY OF PRESENT ILLNESS: Today 07/15/19 Jerry Hudson is a 69 y.o. male here today for follow up for TN. He reports having several flares of pain over the past year. He has been treated with several prednisone tapers in the past that do seem to help. He also continues gabapentin 100mg  three times daily prescribed by PCP. He started having more pain about two weeks ago. He is taking Aleve that is not helping.   MRI was normal in 11/2018.   HISTORY: (copied from Dr Cathren Laine note on 10/29/2018)  10/29/2018: Patient's last flareup was in January and a steroid taper appeared to help him for many months.  He says that the pain just started coming back a few weeks ago.  We discussed percutaneous procedures I had sent him a video several months ago he said he watch but it was a long time ago.  I did convince him to have an MRI of the brain with trigeminal protocol to evaluate for vascular compression or brainstem stroke or other issues with the trigeminal nerve.  In the meantime we will start another taper, last time it took several Medrol Dosepaks so instead of doing that I will start him out very high-dose with prednisone 80 mg and taper over 2 weeks which should help him at least until we get the MRI of the trigeminal nerve.  Discussed side effects of steroids.  Patient understands and agrees will call him after MRI for further evaluation and work-up.  05/14/2018: Last seen several years ago and he has had a flare up now since the end of October. Tried steroids for 9 days and didn't help. Just started gabapentin. Used to be a shooting electric pain now is more constant around his eye and the mouth.  MRi of the trigeminal nerve   HPI:  Jerry Hudson is a 69  y.o. male here as a referral from Dr. Deatra Ina for Trigeminal Neuralgia. PMHx Trigeminal neuralgia and HTN. Facial pain for 5 years. Would occur twice a year in the past but now getting worse and more frequent and has had to have acute management twice this year and had more "flare ups". He had pain from the right facial area radiating to the nose but more recently pain is in the forehead, electric shooting, severe (points to the right supraorbital area). It can last 20-30 seconds repeatedly throughout the day. It can last for days and ongoing. Steroids help and he goes to the pcp. Then he will be fine for a few months and it will get back. In between he is fine no vision changes. It is severe. Unknown triggers, so pressure may trigger but not chewing or brushing however chewing will aggravate it when it is occurring. No known inciting event or trauma. No other focal neurologic deficits, associated symptoms, inciting events or modifiable factors.  Reviewed notes, labs and imaging from outside physicians, which showed:  Reviewed notes, patient was seen by neurology in November 2014 for right sided facial pain over the right cheek radiation into the nose and eyes. Described as tingling. No associated numbness. Symptoms were not exacerbated by chewing, talking or cold air. Light pressure over the right cheek with occasionally worsen his pain. His  PCP started him on prednisone and an MRI of the brain was ordered. At the time of appointment his symptoms had completely resolved. MRI of the brain was unremarkable. Neurologic exam was nonfocal. He was diagnosed with trigeminal neuralgia. No shingles or history of vesicular eruption over the face or rash.  MRI brain: Personally reviewed images and agree with the following IMPRESSION: Scattered small white matter hyperintensities bilaterally. These have a chronic appearance and may be due to chronic microvascular ischemia or migraine headache.    REVIEW OF  SYSTEMS: Out of a complete 14 system review of symptoms, the patient complains only of the following symptoms, facial pain and all other reviewed systems are negative.  ALLERGIES: No Known Allergies  HOME MEDICATIONS: Outpatient Medications Prior to Visit  Medication Sig Dispense Refill  . allopurinol (ZYLOPRIM) 300 MG tablet Take 300 mg by mouth daily.  0  . aspirin EC 81 MG tablet Take 81 mg by mouth daily.    Marland Kitchen BENICAR HCT 40-12.5 MG tablet Take 1 tablet by mouth daily.  1  . gabapentin (NEURONTIN) 100 MG capsule Take 1 capsule (100 mg total) by mouth 3 (three) times daily as needed. 90 capsule 6  . hydrochlorothiazide (HYDRODIURIL) 12.5 MG tablet Take 12.5 mg by mouth daily.     . predniSONE (DELTASONE) 20 MG tablet Start with 4 pills in the morning with food (80 mg) for 4 days.  Then decrease to 3 pills in the morning (60 mg) for 3 days.  Then decrease to 2 pills in the morning (40 mg) for 3 days.  Then decrease to 1 pill (20 mg) for 3 days.  Then stop. 34 tablet 3   No facility-administered medications prior to visit.    PAST MEDICAL HISTORY: Past Medical History:  Diagnosis Date  . Cancer [facility])    prostate cancer; pt reports he's "clean" as of his Nov 2019 check-up  . Hypertension   . Numbness and tingling    in feet bilat comes and goes   . Ruptured lumbar disc    L5 ruptured, L4 deteriorated  . Trauma    great right toe laceration secondary to lawnmower accident   . Trigeminal neuralgia     PAST SURGICAL HISTORY: Past Surgical History:  Procedure Laterality Date  . KNEE ARTHROSCOPY  2006   both knees  . LYMPHADENECTOMY Bilateral 04/20/2015   Procedure: BILATERAL LYMPHADENECTOMY;  Surgeon: Alexis Frock, MD;  Location: [facility];  Service: Urology;  Laterality: Bilateral;  . ROBOT ASSISTED LAPAROSCOPIC RADICAL PROSTATECTOMY N/A 04/20/2015   Procedure: ROBOTIC ASSISTED LAPAROSCOPIC RADICAL PROSTATECTOMY LEVEL 2;  Surgeon: Alexis Frock, MD;  Location: [facility];  Service:  Urology;  Laterality: N/A;    FAMILY HISTORY: Family History  Problem Relation Age of Onset  . Cancer Mother   . Cancer Father   . Diabetes Sister     SOCIAL HISTORY: Social History   Socioeconomic History  . Marital status: Married    Spouse name: Not on file  . Number of children: 3  . Years of education: Not on file  . Highest education level: High school graduate  Occupational History  . Not on file  Tobacco Use  . Smoking status: Never Smoker  . Smokeless tobacco: Never Used  Substance and Sexual Activity  . Alcohol use: Yes    Comment: Occasionally  . Drug use: No  . Sexual activity: Not on file  Other Topics Concern  . 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   Right handed   Caffeine: maybe 3-4 cups a week    Social Determinants of Health   Financial Resource Strain:   . Difficulty of Paying Living Expenses: Not on file  Food Insecurity:   . Worried About Charity fundraiser in the Last Year: Not on file  . Ran Out of Food in the Last Year: Not on file  Transportation Needs:   . Lack of Transportation (Medical): Not on file  . Lack of Transportation (Non-Medical): Not on file  Physical Activity:   . Days of Exercise per Week: Not on file  . Minutes of Exercise per Session: Not on file  Stress:   . Feeling of Stress : Not on file  Social Connections:   . Frequency of Communication with Friends and Family: Not on file  . Frequency of Social Gatherings with Friends and Family: Not on file  . Attends Religious Services: Not on file  . Active Member of Clubs or Organizations: Not on file  . Attends Archivist Meetings: Not on file  . Marital Status: Not on file  Intimate Partner Violence:   . Fear of Current or Ex-Partner: Not on file  . Emotionally Abused: Not on file  . Physically Abused: Not on file  . Sexually Abused: Not on file       PHYSICAL EXAM  Vitals:   07/15/19 1246  BP: 138/88  Pulse: 73  Temp: (!) 97.3 F (36.3 C)  Weight: 260 lb (117.9 kg)  Height: 6\' 1"  (1.854 m)   Body mass index is 34.3 kg/m.  Generalized: Well developed, in no acute distress  Cardiology: normal rate and rhythm, no murmur noted Neurological examination  Mentation: Alert oriented to time, place, history taking. Follows all commands speech and language fluent Cranial nerve II-XII: Pupils were equal round reactive to light. Extraocular movements were full, visual field were full  Motor: The motor testing reveals 5 over 5 strength of all 4 extremities. Good symmetric motor tone is noted throughout. Pain and mild edema of right knee  Sensory: Sensory testing is intact to soft touch on all 4 extremities. No evidence of extinction is noted.  Coordination: Cerebellar testing reveals good finger-nose-finger and heel-to-shin bilaterally.  Gait and station: Gait is normal.   DIAGNOSTIC DATA (LABS, IMAGING, TESTING) - I reviewed patient records, labs, notes, testing and imaging myself where available.  No flowsheet data found.   Lab Results  Component Value Date   WBC 6.8 04/15/2015   HGB 11.8 (L) 04/21/2015   HCT 35.7 (L) 04/21/2015   MCV 93.7 04/15/2015   PLT 222 04/15/2015      Component Value Date/Time   NA 142 05/14/2018 1503   K 4.3 05/14/2018 1503   CL 102 05/14/2018 1503   CO2 24 05/14/2018 1503   GLUCOSE 91 05/14/2018 1503   GLUCOSE 149 (H) 04/21/2015 0506   BUN 15 05/14/2018 1503   CREATININE 0.91 05/14/2018 1503   CALCIUM 9.8 05/14/2018 1503   GFRNONAA 88 05/14/2018 1503   GFRAA 102 05/14/2018 1503   No results found for: CHOL, HDL, LDLCALC, LDLDIRECT, TRIG, CHOLHDL No results found for: HGBA1C No results found for: VITAMINB12 No results found for: TSH     ASSESSMENT AND PLAN 69 y.o. year old male  has a past medical history of Cancer ([place]), Hypertension, Numbness and tingling, Ruptured lumbar disc,  Trauma, and Trigeminal  neuralgia. here with     ICD-10-CM   1. Trigeminal neuralgia  G50.0     Mr Hudson reports frequent trigeminal neuralgia flares over the past year.  He continues gabapentin 100 mg 3 times daily but does not feel that this has been very effective in managing pain.  He has taken multiple rounds of prednisone tapers in the past that seem to help somewhat.  We have discussed several options to help manage trigeminal pain.  He would prefer to start oxcarbazepine 300 mg twice daily.  He may continue gabapentin 100 mg 3 times daily as directed by PCP.  We may consider increasing dose in the future if needed.  We have discussed benefit of baclofen in an acute flare, however, he wishes to hold off on this therapy for now.  Neuro exam intact.  No other concerning symptoms today.  He has scheduled follow-up in approximately 4 weeks.  He will hang onto this appointment for now.  If pain resolves and he is tolerating medications well, he may follow-up in 1 year.  He verbalizes understanding and agreement with this plan.   No orders of the defined types were placed in this encounter.    Meds ordered this encounter  Medications  . Oxcarbazepine (TRILEPTAL) 300 MG tablet    Sig: Take 1 tablet (300 mg total) by mouth 2 (two) times daily.    Dispense:  180 tablet    Refill:  3    Order Specific Question:   Supervising Provider    Answer:   Melvenia Beam [id number]      I spent 15 minutes with the patient. 50% of this time was spent counseling and educating patient on plan of care and medications.    Debbora Presto, FNP-C 07/15/2019, 1:30 PM [facility] [address] [place], [place] [address] [phone] Made any corrections needed, and agree with history, physical, neuro exam,assessment and plan as stated.     Sarina Ill, MD [facility]

## 2019-08-03 ENCOUNTER — Ambulatory Visit: Payer: Medicare Other | Admitting: Neurology

## 2019-12-24 ENCOUNTER — Other Ambulatory Visit: Payer: Self-pay

## 2019-12-24 ENCOUNTER — Encounter: Payer: Self-pay | Admitting: Cardiology

## 2019-12-24 ENCOUNTER — Ambulatory Visit: Payer: Medicare Other | Admitting: Cardiology

## 2019-12-24 VITALS — BP 141/99 | HR 67 | Resp 17 | Ht 73.0 in | Wt 278.0 lb

## 2019-12-24 DIAGNOSIS — I4891 Unspecified atrial fibrillation: Secondary | ICD-10-CM

## 2019-12-24 DIAGNOSIS — Z7901 Long term (current) use of anticoagulants: Secondary | ICD-10-CM

## 2019-12-24 DIAGNOSIS — E782 Mixed hyperlipidemia: Secondary | ICD-10-CM

## 2019-12-24 DIAGNOSIS — I10 Essential (primary) hypertension: Secondary | ICD-10-CM

## 2019-12-24 NOTE — Progress Notes (Signed)
Date:  12/24/2019   ID:  JUDSON TSAN, DOB October 05, 1950, MRN [id number]  PCP:  Aletha Halim., PA-C  Cardiologist:  Rex Kras, DO, [facility] (established care 12/24/2019)  REASON FOR CONSULT: Newly discovered atrial fibrillation.   REQUESTING PHYSICIAN:  Aletha Halim., PA-C [address]  [place] [address]  Chief Complaint  Patient presents with  . Atrial Fibrillation  . New Patient (Initial Visit)    HPI  Jerry Hudson is a 69 y.o. male who presents to the office with a chief complaint of " management of atrial fibrillation." He is referred to the office at the request of Aletha Halim., PA-C. Patient's past medical history and cardiovascular risk factors include: Newly discovered atrial fibrillation, hypertension, history of prostate cancer status post robotic assisted laparoscopic prostatectomy with bilateral pelvic lymph node dissection repair, trigeminal neuralgia status post nerve decompression, history of varicose veins, obesity due to excess calories, advanced age.  Patient is referred to the office at the request of his primary care provider for evaluation and management of atrial fibrillation.  Patient stated that he was scheduled for a routine colonoscopy in July 2021 and during the procedure he was noted to go into atrial fibrillation.  After the procedure patient was evaluated by his primary care provider and was started on Lopressor as needed and Xarelto for thromboembolic prophylaxis.  He was referred to cardiology for further evaluation and management.  Patient denies any chest pain or shortness of breath at rest or with effort related activities.  He has noticed that he is more tired and fatigued he was attributing this to his underlying aging.  No prior history of gastrointestinal bleeding or intracranial bleeding.  Patient has tolerated Xarelto well without any side effects or intolerances or evidence of bleeding.  Denies prior history of  coronary artery disease, myocardial infarction, congestive heart failure, deep venous thrombosis, pulmonary embolism, stroke, transient ischemic attack.  FUNCTIONAL STATUS: He does yard work regularly but no structured exercise program or daily routine.   ALLERGIES: No Known Allergies  MEDICATION LIST PRIOR TO VISIT: Current Meds  Medication Sig  . allopurinol (ZYLOPRIM) 300 MG tablet Take 300 mg by mouth daily.  . hydrochlorothiazide (HYDRODIURIL) 25 MG tablet Take 1 tablet by mouth daily.  . metoprolol tartrate (LOPRESSOR) 25 MG tablet Take 12.5 mg by mouth 2 (two) times daily as needed.  Marland Kitchen olmesartan (BENICAR) 40 MG tablet Take 1 tablet by mouth daily.  . rivaroxaban (XARELTO) 20 MG TABS tablet Take 1 tablet by mouth daily.     PAST MEDICAL HISTORY: Past Medical History:  Diagnosis Date  . Cancer [facility])    prostate cancer; pt reports he's "clean" as of his Nov 2019 check-up  . Hyperlipidemia   . Hypertension   . Numbness and tingling    in feet bilat comes and goes   . Ruptured lumbar disc    L5 ruptured, L4 deteriorated  . Trauma    great right toe laceration secondary to lawnmower accident   . Trigeminal neuralgia     PAST SURGICAL HISTORY: Past Surgical History:  Procedure Laterality Date  . KNEE ARTHROSCOPY  2006   both knees  . LYMPHADENECTOMY Bilateral 04/20/2015   Procedure: BILATERAL LYMPHADENECTOMY;  Surgeon: Alexis Frock, MD;  Location: [facility];  Service: Urology;  Laterality: Bilateral;  . ROBOT ASSISTED LAPAROSCOPIC RADICAL PROSTATECTOMY N/A 04/20/2015   Procedure: ROBOTIC ASSISTED LAPAROSCOPIC RADICAL PROSTATECTOMY LEVEL 2;  Surgeon: Alexis Frock, MD;  Location: [facility];  Service: Urology;  Laterality: N/A;  . TRIGEMINAL NERVE DECOMPRESSION      FAMILY HISTORY: The patient family history includes Cancer in his father and mother; Diabetes in his sister.  SOCIAL HISTORY:  The patient  reports that he has never smoked. He has never used smokeless  tobacco. He reports current alcohol use. He reports that he does not use drugs.  REVIEW OF SYSTEMS: Review of Systems  Constitutional: Negative for chills and fever.  HENT: Negative for hoarse voice and nosebleeds.   Eyes: Negative for discharge, double vision and pain.  Cardiovascular: Negative for chest pain, claudication, dyspnea on exertion, leg swelling, near-syncope, orthopnea, palpitations, paroxysmal nocturnal dyspnea and syncope.  Respiratory: Negative for hemoptysis and shortness of breath.   Musculoskeletal: Negative for muscle cramps and myalgias.  Gastrointestinal: Negative for abdominal pain, constipation, diarrhea, hematemesis, hematochezia, melena, nausea and vomiting.  Neurological: Negative for light-headedness.    PHYSICAL EXAM: Vitals with BMI 12/24/2019 07/15/2019 05/14/2018  Height _0  _1  _2   Weight 278 lbs 260 lbs 258 lbs  BMI 36.69 44.03 47.42  Systolic 595 638 756  Diastolic 99 88 90  Pulse 67 73 80    CONSTITUTIONAL: Well-developed and well-nourished. No acute distress.  SKIN: Skin is warm and dry. No rash noted. No cyanosis. No pallor. No jaundice HEAD: Normocephalic and atraumatic.  EYES: No scleral icterus MOUTH/THROAT: Moist oral membranes.  NECK: No JVD present. No thyromegaly noted. No carotid bruits  LYMPHATIC: No visible cervical adenopathy.  CHEST Normal respiratory effort. No intercostal retractions  LUNGS: Clear to auscultation bilaterally.  No stridor. No wheezes. No rales.  CARDIOVASCULAR: Irregularly irregular, positive E3-P2, soft holosystolic murmur heard at the apex, no gallops or rubs. ABDOMINAL: Obese, soft, nontender, nondistended, positive bowel sounds all 4 quadrants. No apparent ascites.  EXTREMITIES: No peripheral edema.  Varicose veins bilaterally. HEMATOLOGIC: No significant bruising NEUROLOGIC: Oriented to person, place, and time. Nonfocal. Normal muscle tone.  PSYCHIATRIC: Normal mood and affect. Normal behavior.  Cooperative  CARDIAC DATABASE: EKG: 12/24/2019: Atrial fibrillation, 75 bpm, poor R wave progression, low voltage.  Echocardiogram: None  Stress Testing: None  Heart Catheterization: None  LABORATORY DATA: External Labs: Collected: 12/04/2019 at [facility] from care everywhere Creatinine 1.3 mg/dL. eGFR: 56 mL/min per 1.73 m Hemoglobin 13.8 g/dL TSH 1.99  IMPRESSION:    ICD-10-CM   1. Atrial fibrillation, unspecified type (HCC)  I48.91 EKG 12-Lead    PCV ECHOCARDIOGRAM COMPLETE    PCV MYOCARDIAL PERFUSION WITH LEXISCAN  2. Long term (current) use of anticoagulants  Z79.01   3. Benign hypertension  I10   4. Mixed hyperlipidemia  E78.2   5. Class 2 severe obesity due to excess calories with serious comorbidity and body mass index (BMI) of 36.0 to 36.9 in adult [facility])  E66.01    Z68.36      RECOMMENDATIONS: Jerry Hudson is a 69 y.o. male whose past medical history and cardiac risk factors include: Newly discovered atrial fibrillation, hypertension, history of prostate cancer status post robotic assisted laparoscopic prostatectomy with bilateral pelvic lymph node dissection repair, trigeminal neuralgia status post nerve decompression, history of varicose veins, obesity due to excess calories, advanced age.  Atrial fibrillation: . I had a long and detailed discussion with the patient regarding the incidence, etiology, pathophysiology, prognosis, and therapeutic options for atrial fibrillation.  Specifically we discussed oral anticoagulation for stroke prevention. . CHA2DS2-VASc SCORE is 2 (age, HTN) which correlates to 2.2 % risk of stroke per  year.  . Already started on Xarelto for thromboembolic prophylaxis prior to establishing care.  I reemphasized the risk and benefits of anticoagulation. . Plan echocardiogram. . Plan nuclear stress test to rule out reversible ischemia given the new diagnosis of atrial fibrillation. . Patient is recommended to  start Lopressor 12.5 mg p.o. twice daily.  Benign essential hypertension: Currently managed by primary care provider.  Patient is recommended to have lipid profile and hemoglobin A1c to screen for hypercholesterolemia and diabetes respectively.  FINAL MEDICATION LIST END OF ENCOUNTER: No orders of the defined types were placed in this encounter.    Current Outpatient Medications:  .  allopurinol (ZYLOPRIM) 300 MG tablet, Take 300 mg by mouth daily., Disp: , Rfl: 0 .  hydrochlorothiazide (HYDRODIURIL) 25 MG tablet, Take 1 tablet by mouth daily., Disp: , Rfl:  .  metoprolol tartrate (LOPRESSOR) 25 MG tablet, Take 12.5 mg by mouth 2 (two) times daily as needed., Disp: , Rfl:  .  olmesartan (BENICAR) 40 MG tablet, Take 1 tablet by mouth daily., Disp: , Rfl:  .  rivaroxaban (XARELTO) 20 MG TABS tablet, Take 1 tablet by mouth daily., Disp: , Rfl:   Orders Placed This Encounter  Procedures  . PCV MYOCARDIAL PERFUSION WITH LEXISCAN  . EKG 12-Lead  . PCV ECHOCARDIOGRAM COMPLETE    There are no Patient Instructions on file for this visit.   --Continue cardiac medications as reconciled in final medication list. --Return in about 6 weeks (around 02/04/2020) for afib follow up., EKG prior to arrival., review test results.. Or sooner if needed. --Continue follow-up with your primary care physician regarding the management of your other chronic comorbid conditions.  Patient's questions and concerns were addressed to his satisfaction. He voices understanding of the instructions provided during this encounter.   This note was created using a voice recognition software as a result there may be grammatical errors inadvertently enclosed that do not reflect the nature of this encounter. Every attempt is made to correct such errors.  Rex Kras, [place], [facility]  Pager: [phone] Office: [phone]

## 2019-12-28 ENCOUNTER — Ambulatory Visit: Payer: Medicare Other

## 2019-12-28 ENCOUNTER — Other Ambulatory Visit: Payer: Self-pay

## 2019-12-28 DIAGNOSIS — I4891 Unspecified atrial fibrillation: Secondary | ICD-10-CM

## 2019-12-29 ENCOUNTER — Telehealth: Payer: Self-pay

## 2019-12-29 NOTE — Telephone Encounter (Signed)
Called patient, [facility] if FULL, will try again later. [place], [place]

## 2019-12-29 NOTE — Telephone Encounter (Signed)
-----   Message from [place], [place] sent at 12/28/2019 11:32 PM EDT ----- The nuclear stress test that was recently performed was reported as normal perfusion and low risk study.  The other details of the report will be discussed at the next office visit.  Patient has an appointment for echo on January 07, 2020.  Please move up his follow-up appointment to the week following.

## 2020-01-07 ENCOUNTER — Ambulatory Visit: Payer: Medicare Other

## 2020-01-07 ENCOUNTER — Other Ambulatory Visit: Payer: Self-pay

## 2020-01-07 DIAGNOSIS — I4891 Unspecified atrial fibrillation: Secondary | ICD-10-CM

## 2020-02-08 ENCOUNTER — Ambulatory Visit: Payer: Medicare Other | Admitting: Cardiology

## 2020-02-08 ENCOUNTER — Other Ambulatory Visit: Payer: Self-pay

## 2020-02-08 ENCOUNTER — Encounter: Payer: Self-pay | Admitting: Cardiology

## 2020-02-08 VITALS — BP 111/79 | HR 73 | Ht 73.0 in | Wt 265.0 lb

## 2020-02-08 DIAGNOSIS — Z712 Person consulting for explanation of examination or test findings: Secondary | ICD-10-CM

## 2020-02-08 DIAGNOSIS — Z6834 Body mass index (BMI) 34.0-34.9, adult: Secondary | ICD-10-CM

## 2020-02-08 DIAGNOSIS — Z7901 Long term (current) use of anticoagulants: Secondary | ICD-10-CM

## 2020-02-08 DIAGNOSIS — E782 Mixed hyperlipidemia: Secondary | ICD-10-CM

## 2020-02-08 DIAGNOSIS — I4819 Other persistent atrial fibrillation: Secondary | ICD-10-CM

## 2020-02-08 DIAGNOSIS — I10 Essential (primary) hypertension: Secondary | ICD-10-CM

## 2020-02-08 NOTE — Progress Notes (Signed)
Date:  02/08/2020   ID:  Jerry Hudson, DOB 01/30/1951, MRN [id number]  PCP:  Aletha Halim., PA-C  Cardiologist:  Rex Kras, DO, [facility] (established care 12/24/2019)  Date: 02/08/20 Last Office Visit: 12/24/2019  Chief Complaint  Patient presents with  . Atrial Fibrillation  . Follow-up    HPI  Jerry Hudson is a 69 y.o. male who presents to the office with a chief complaint of " management of atrial fibrillation." Patient's past medical history and cardiovascular risk factors include: Newly discovered atrial fibrillation, hypertension, history of prostate cancer status post robotic assisted laparoscopic prostatectomy with bilateral pelvic lymph node dissection repair, trigeminal neuralgia status post nerve decompression, history of varicose veins, obesity due to excess calories, advanced age.  Patient is referred to the office at the request of his primary care provider for evaluation and management of atrial fibrillation.  Patient stated that he was scheduled for a routine colonoscopy in July 2021 and during the procedure he was noted to go into atrial fibrillation.  After the procedure patient was evaluated by his primary care provider and was started on Lopressor and Xarelto for thromboembolic prophylaxis.  He was referred to cardiology for further evaluation and management.  Patient has remained on Lopressor and his ventricular rate has improved.  He continues to be on oral anticoagulation without any evidence of bleeding.  EKG at today's office visit notes continued atrial fibrillation.  Patient underwent an ischemic evaluation including an echocardiogram and stress test results which were reviewed with him at today's office visit and noted below for further reference.  Clinically patient is asymptomatic and does not have symptoms of palpitation, chest pain or dyspnea on exertion.  No prior history of gastrointestinal bleeding or intracranial bleeding.  Denies prior history  of coronary artery disease, myocardial infarction, congestive heart failure, deep venous thrombosis, pulmonary embolism, stroke, transient ischemic attack.  FUNCTIONAL STATUS: He does yard work regularly but no structured exercise program or daily routine.   ALLERGIES: No Known Allergies  MEDICATION LIST PRIOR TO VISIT: Current Meds  Medication Sig  . allopurinol (ZYLOPRIM) 300 MG tablet Take 300 mg by mouth daily.  . hydrochlorothiazide (HYDRODIURIL) 25 MG tablet Take 1 tablet by mouth daily.  . metoprolol tartrate (LOPRESSOR) 25 MG tablet Take 12.5 mg by mouth 2 (two) times daily as needed.  Marland Kitchen olmesartan (BENICAR) 40 MG tablet Take 1 tablet by mouth daily.  . rivaroxaban (XARELTO) 20 MG TABS tablet Take 1 tablet by mouth daily.     PAST MEDICAL HISTORY: Past Medical History:  Diagnosis Date  . Cancer [facility])    prostate cancer; pt reports he's "clean" as of his Nov 2019 check-up  . Hypertension   . Numbness and tingling    in feet bilat comes and goes   . Ruptured lumbar disc    L5 ruptured, L4 deteriorated  . Trauma    great right toe laceration secondary to lawnmower accident   . Trigeminal neuralgia     PAST SURGICAL HISTORY: Past Surgical History:  Procedure Laterality Date  . KNEE ARTHROSCOPY  2006   both knees  . LYMPHADENECTOMY Bilateral 04/20/2015   Procedure: BILATERAL LYMPHADENECTOMY;  Surgeon: Alexis Frock, MD;  Location: [facility];  Service: Urology;  Laterality: Bilateral;  . ROBOT ASSISTED LAPAROSCOPIC RADICAL PROSTATECTOMY N/A 04/20/2015   Procedure: ROBOTIC ASSISTED LAPAROSCOPIC RADICAL PROSTATECTOMY LEVEL 2;  Surgeon: Alexis Frock, MD;  Location: [facility];  Service: Urology;  Laterality: N/A;  . TRIGEMINAL NERVE DECOMPRESSION  FAMILY HISTORY: The patient family history includes Cancer in his father and mother; Diabetes in his sister.  SOCIAL HISTORY:  The patient  reports that he has never smoked. He has never used smokeless tobacco. He reports  current alcohol use. He reports that he does not use drugs.  REVIEW OF SYSTEMS: Review of Systems  Constitutional: Negative for chills and fever.  HENT: Negative for hoarse voice and nosebleeds.   Eyes: Negative for discharge, double vision and pain.  Cardiovascular: Negative for chest pain, claudication, dyspnea on exertion, leg swelling, near-syncope, orthopnea, palpitations, paroxysmal nocturnal dyspnea and syncope.  Respiratory: Negative for hemoptysis and shortness of breath.   Musculoskeletal: Negative for muscle cramps and myalgias.  Gastrointestinal: Negative for abdominal pain, constipation, diarrhea, hematemesis, hematochezia, melena, nausea and vomiting.  Neurological: Negative for light-headedness.    PHYSICAL EXAM: Vitals with BMI 02/08/2020 12/24/2019 07/15/2019  Height $Remov'6\' 1"'kaXttM$  $Remove'6\' 1"'eymPjDx$  $RemoveB'6\' 1"'ziBTzLiq$   Weight 265 lbs 278 lbs 260 lbs  BMI 34.97 80.16 55.37  Systolic 482 707 867  Diastolic 79 99 88  Pulse 73 67 73    CONSTITUTIONAL: Well-developed and well-nourished. No acute distress.  SKIN: Skin is warm and dry. No rash noted. No cyanosis. No pallor. No jaundice HEAD: Normocephalic and atraumatic.  EYES: No scleral icterus MOUTH/THROAT: Moist oral membranes.  NECK: No JVD present. No thyromegaly noted. No carotid bruits  LYMPHATIC: No visible cervical adenopathy.  CHEST Normal respiratory effort. No intercostal retractions  LUNGS: Clear to auscultation bilaterally.  No stridor. No wheezes. No rales.  CARDIOVASCULAR: Irregularly irregular, positive J4-G9, soft holosystolic murmur heard at the apex, no gallops or rubs. ABDOMINAL: Obese, soft, nontender, nondistended, positive bowel sounds all 4 quadrants. No apparent ascites.  EXTREMITIES: No peripheral edema.  Varicose veins bilaterally. HEMATOLOGIC: No significant bruising NEUROLOGIC: Oriented to person, place, and time. Nonfocal. Normal muscle tone.  PSYCHIATRIC: Normal mood and affect. Normal behavior. Cooperative  CARDIAC  DATABASE: EKG: 12/24/2019: Atrial fibrillation, 75 bpm, poor R wave progression, low voltage.  Echocardiogram: 01/07/2020:  Study Quality: Technically Difficult  Normal LV systolic function with visual EF 50-55%. Left ventricle cavity is normal in size. Moderate left ventricular hypertrophy. Normal global wall motion. Indeterminate LAP. Unable to evaluate diastolic function due to atrial fibrillation. Calculated EF 50%.  Left atrial cavity is mildly dilated.  Mild (Grade I) mitral regurgitation.  Mild tricuspid regurgitation. No evidence of pulmonary hypertension.  Mild pulmonic regurgitation.  No prior study for comparison.  Stress Testing: Lexiscan Tetrofosmin Stress Test 12/28/2019:  Non-diagnostic ECG stress. Resting EKG demonstrated atrial fibrillation. Non-specific T-wave abnormalities.  Peak EKG revealed no significant ST-T change from baseline abnormality.  Myocardial perfusion is normal.  Overall LV systolic function is normal without regional wall motion abnormalities. Stress LV EF: 70%.  No previous exam available for comparison. Low risk study.   Heart Catheterization: None  LABORATORY DATA: External Labs: Col[facility]: 12/04/2019 [facility] from care everywhere Creatinine 1.3 mg/dL. eGFR: 56 mL/min per 1.73 m Hemoglobin 13.8 g/dL TSH 1.99  IMPRESSION:    ICD-10-CM   1. Persistent atrial fibrillation (HCC)  I48.19 EKG 12-Lead  2. Long term (current) use of anticoagulants  Z79.01   3. Benign hypertension  I10   4. Mixed hyperlipidemia  E78.2   5. Class 1 obesity due to excess calories with serious comorbidity and body mass index (BMI) of 34.0 to 34.9 in adult  E66.09    Z68.34   6. Encounter to discuss test results  Z71.2  RECOMMENDATIONS: Jerry Hudson is a 69 y.o. male whose past medical history and cardiac risk factors include: Newly discovered atrial fibrillation, hypertension, history of prostate cancer status post robotic  assisted laparoscopic prostatectomy with bilateral pelvic lymph node dissection repair, trigeminal neuralgia status post nerve decompression, history of varicose veins, obesity due to excess calories, advanced age.  Atrial fibrillation, persistent: . Continue Lopressor for rate control. . Continue Xarelto for thromboembolic prophylaxis. . CHA2DS2-VASc SCORE is 2 (age, HTN) which correlates to 2.2 % risk of stroke per year.  . Patient does not endorse any evidence of bleeding. . Reemphasized the risks, benefits, and alternatives to oral anticoagulation. . Ischemic evaluation reviewed with the patient at today's office visit and noted above for further reference. . Since the patient remains in atrial fibrillation recommended TEE guided direct-current cardioversion to hopefully restore normal sinus rhythm.  Patient states that he will discuss it further with her as daughter and call the office back with this decision. . After careful review of history and examination, the risks, benefits of transesophageal echocardiogram, and alternatives have been explained to the patient. Complications include but not limited to esophageal perforation (rare), gastrointestinal bleeding (rare), cardiac arrhythmia which can include cardiac arrest and death (rare), pharyngeal irritation / discomfort with swallowing / hematoma, methemoglobinemia, bronchospasm, transient hypoxia, nonsustained ventricular tachycardia, transient atrial fibrillation, minimal hemoptysis, vomiting, hypotension, respiratory compromise, reaction to medications, unavoidable damage to teeth and gums, aspiration pneumonia  were reviewed with the patient.  Patient voices understands, provides verbal feedback, questions answered, and patient would like to discuss with her daughter prior to scheduling the procedure.  He will call the office back with his decision.   . Risks, benefits, and alternatives of direct current cardioversion reviewed with the and  patient. Risk includes but not limited to: potential for post-cardioversion rhythms, life-threatening arrhythmias (ventricular tachycardia and fibrillation, profound bradycardia, cardiac arrest) which may require cardiopulmonary resuscitation also known as CPR, myocardial damage, acute pulmonary edema, skin burns, transient hypotension. Benefits include restoration of sinus rhythm. Alternatives to treatment were discussed, questions were answered, patient voices understanding and provides verbal feedback.  Patient would like to discuss with her daughter prior to scheduling the procedure.  He will call the office back with his decision.  . Patient understands that even after undergoing TEE cardioversion there is a possibility that he may go back into atrial fibrillation.  Benign essential hypertension: Currently managed by primary care provider.  FINAL MEDICATION LIST END OF ENCOUNTER: No orders of the defined types were placed in this encounter.    Current Outpatient Medications:  .  allopurinol (ZYLOPRIM) 300 MG tablet, Take 300 mg by mouth daily., Disp: , Rfl: 0 .  hydrochlorothiazide (HYDRODIURIL) 25 MG tablet, Take 1 tablet by mouth daily., Disp: , Rfl:  .  metoprolol tartrate (LOPRESSOR) 25 MG tablet, Take 12.5 mg by mouth 2 (two) times daily as needed., Disp: , Rfl:  .  olmesartan (BENICAR) 40 MG tablet, Take 1 tablet by mouth daily., Disp: , Rfl:  .  rivaroxaban (XARELTO) 20 MG TABS tablet, Take 1 tablet by mouth daily., Disp: , Rfl:   Orders Placed This Encounter  Procedures  . EKG 12-Lead   There are no Patient Instructions on file for this visit.   --Continue cardiac medications as reconciled in final medication list. --Return in about 3 months (around 05/10/2020) for Reevaluation of  A. fib and consider TEE / cardioversion. Or sooner if needed. --Continue follow-up with your primary care physician regarding the  management of your other chronic comorbid conditions.  Patient's  questions and concerns were addressed to his satisfaction. He voices understanding of the instructions provided during this encounter.   This note was created using a voice recognition software as a result there may be grammatical errors inadvertently enclosed that do not reflect the nature of this encounter. Every attempt is made to correct such errors.  Rex Kras, [place], [facility]  Pager: [phone] Office: [phone]

## 2020-05-10 ENCOUNTER — Encounter: Payer: Self-pay | Admitting: Cardiology

## 2020-05-10 ENCOUNTER — Ambulatory Visit: Payer: Medicare Other | Admitting: Cardiology

## 2020-05-10 ENCOUNTER — Other Ambulatory Visit: Payer: Self-pay

## 2020-05-10 VITALS — BP 136/89 | HR 63 | Resp 16 | Ht 73.0 in | Wt 261.4 lb

## 2020-05-10 DIAGNOSIS — E6609 Other obesity due to excess calories: Secondary | ICD-10-CM

## 2020-05-10 DIAGNOSIS — Z6834 Body mass index (BMI) 34.0-34.9, adult: Secondary | ICD-10-CM

## 2020-05-10 DIAGNOSIS — I10 Essential (primary) hypertension: Secondary | ICD-10-CM

## 2020-05-10 DIAGNOSIS — Z7901 Long term (current) use of anticoagulants: Secondary | ICD-10-CM

## 2020-05-10 DIAGNOSIS — I4819 Other persistent atrial fibrillation: Secondary | ICD-10-CM

## 2020-05-10 NOTE — H&P (View-Only) (Signed)
 Date:  05/10/2020   ID:  Jerry Hudson, DOB 02/08/1951, MRN [id number]  PCP:  Kaplan, Kristen W., PA-C  Cardiologist:  Wilgus Deyton, DO, FACC (established care 12/24/2019)  Date: 05/10/20 Last Office Visit: 02/08/2020  Chief Complaint  Patient presents with  . Persistent atrial fibrillation   . Follow-up    HPI  Jerry Hudson is a 69 y.o. male who presents to the office with a chief complaint of " atrial fibrillation 3-month follow-up." Patient's past medical history and cardiovascular risk factors include: Newly discovered atrial fibrillation, hypertension, history of prostate cancer status post robotic assisted laparoscopic prostatectomy with bilateral pelvic lymph node dissection repair, trigeminal neuralgia status post nerve decompression, history of varicose veins, obesity due to excess calories, advanced age.  Patient is referred to the office at the request of his primary care provider for evaluation and management of atrial fibrillation.  He was scheduled for a routine colonoscopy in July 2021 and during the procedure he was noted to go into atrial fibrillation.  After the procedure patient was evaluated by his primary care provider and was started on Lopressor and Xarelto for thromboembolic prophylaxis.  He was referred to cardiology for further evaluation and management.  He continues to be on Lopressor and oral anticoagulation without any evidence of bleeding.  Ischemic evaluation including an echocardiogram and stress test results results noted below.  At the last visit we discussed undergoing TEE cardioversion.  Patient states that he would think about it and call us back with his decision.  He presents today for 3-month follow-up.  Patient still remains in atrial fibrillation but overall asymptomatic.  Patient states that he is considering undergoing TEE cardioversion; however, he is currently taking care of his brother.  Patient states that once his brother is back to baseline  he will call the office to have the TEE cardioversion scheduled.  Patient is tolerating metoprolol without any side effects and does not endorse any evidence of bleeding.   FUNCTIONAL STATUS: He does yard work regularly but no structured exercise program or daily routine.   ALLERGIES: No Known Allergies  MEDICATION LIST PRIOR TO VISIT: Current Meds  Medication Sig  . allopurinol (ZYLOPRIM) 300 MG tablet Take 300 mg by mouth daily.  . hydrochlorothiazide (HYDRODIURIL) 25 MG tablet Take 1 tablet by mouth daily.  . metoprolol tartrate (LOPRESSOR) 25 MG tablet Take 12.5 mg by mouth 2 (two) times daily as needed.  . olmesartan (BENICAR) 40 MG tablet Take 1 tablet by mouth daily.  . Oxcarbazepine (TRILEPTAL) 300 MG tablet Take 300 mg by mouth 2 (two) times daily.  . rivaroxaban (XARELTO) 20 MG TABS tablet Take 1 tablet by mouth daily.     PAST MEDICAL HISTORY: Past Medical History:  Diagnosis Date  . Cancer (HCC)    prostate cancer; pt reports he's "clean" as of his Nov 2019 check-up  . Hypertension   . Numbness and tingling    in feet bilat comes and goes   . Ruptured lumbar disc    L5 ruptured, L4 deteriorated  . Trauma    great right toe laceration secondary to lawnmower accident   . Trigeminal neuralgia     PAST SURGICAL HISTORY: Past Surgical History:  Procedure Laterality Date  . KNEE ARTHROSCOPY  2006   both knees  . LYMPHADENECTOMY Bilateral 04/20/2015   Procedure: BILATERAL LYMPHADENECTOMY;  Surgeon: Theodore Manny, MD;  Location: [facility];  Service: Urology;  Laterality: Bilateral;  . ROBOT ASSISTED LAPAROSCOPIC RADICAL PROSTATECTOMY N/A   04/20/2015   Procedure: ROBOTIC ASSISTED LAPAROSCOPIC RADICAL PROSTATECTOMY LEVEL 2;  Surgeon: Theodore Manny, MD;  Location: [facility];  Service: Urology;  Laterality: N/A;  . TRIGEMINAL NERVE DECOMPRESSION      FAMILY HISTORY: The patient family history includes Cancer in his father and mother; Diabetes in his sister.  SOCIAL  HISTORY:  The patient  reports that he has never smoked. He has never used smokeless tobacco. He reports current alcohol use. He reports that he does not use drugs.  REVIEW OF SYSTEMS: Review of Systems  Constitutional: Negative for chills and fever.  HENT: Negative for hoarse voice and nosebleeds.   Eyes: Negative for discharge, double vision and pain.  Cardiovascular: Negative for chest pain, claudication, dyspnea on exertion, leg swelling, near-syncope, orthopnea, palpitations, paroxysmal nocturnal dyspnea and syncope.  Respiratory: Negative for hemoptysis and shortness of breath.   Musculoskeletal: Negative for muscle cramps and myalgias.  Gastrointestinal: Negative for abdominal pain, constipation, diarrhea, hematemesis, hematochezia, melena, nausea and vomiting.  Neurological: Negative for light-headedness.    PHYSICAL EXAM: Vitals with BMI 05/10/2020 05/10/2020 02/08/2020  Height - 6' 1" 6' 1"  Weight - 261 lbs 6 oz 265 lbs  BMI - 34.49 34.97  Systolic 136 126 111  Diastolic 89 92 79  Pulse 63 64 73    CONSTITUTIONAL: Well-developed and well-nourished. No acute distress.  SKIN: Skin is warm and dry. No rash noted. No cyanosis. No pallor. No jaundice HEAD: Normocephalic and atraumatic.  EYES: No scleral icterus MOUTH/THROAT: Moist oral membranes.  NECK: No JVD present. No thyromegaly noted. No carotid bruits  LYMPHATIC: No visible cervical adenopathy.  CHEST Normal respiratory effort. No intercostal retractions  LUNGS: Clear to auscultation bilaterally.  No stridor. No wheezes. No rales.  CARDIOVASCULAR: Irregularly irregular, positive S1-S2, soft holosystolic murmur heard at the apex, no gallops or rubs. ABDOMINAL: Obese, soft, nontender, nondistended, positive bowel sounds all 4 quadrants. No apparent ascites.  EXTREMITIES: No peripheral edema.  Varicose veins bilaterally. HEMATOLOGIC: No significant bruising NEUROLOGIC: Oriented to person, place, and time. Nonfocal. Normal  muscle tone.  PSYCHIATRIC: Normal mood and affect. Normal behavior. Cooperative  CARDIAC DATABASE: EKG: 12/24/2019: Atrial fibrillation, 75 bpm, poor R wave progression, low voltage. 05/10/2020: Atrial fibrillation,65bpm, without underlying injury pattern.   Echocardiogram: 01/07/2020:  Study Quality: Technically Difficult  Normal LV systolic function with visual EF 50-55%. Left ventricle cavity is normal in size. Moderate left ventricular hypertrophy. Normal global wall motion. Indeterminate LAP. Unable to evaluate diastolic function due to atrial fibrillation. Calculated EF 50%.  Left atrial cavity is mildly dilated.  Mild (Grade I) mitral regurgitation.  Mild tricuspid regurgitation. No evidence of pulmonary hypertension.  Mild pulmonic regurgitation.  No prior study for comparison.  Stress Testing: Lexiscan Tetrofosmin Stress Test 12/28/2019:  Non-diagnostic ECG stress. Resting EKG demonstrated atrial fibrillation. Non-specific T-wave abnormalities.  Peak EKG revealed no significant ST-T change from baseline abnormality.  Myocardial perfusion is normal.  Overall LV systolic function is normal without regional wall motion abnormalities. Stress LV EF: 70%.  No previous exam available for comparison. Low risk study.   Heart Catheterization: None  LABORATORY DATA: External Labs: Collected: 12/04/2019 at [facility] from care everywhere Creatinine 1.3 mg/dL. eGFR: 56 mL/min per 1.73 m Hemoglobin 13.8 g/dL TSH 1.99  IMPRESSION:    ICD-10-CM   1. Persistent atrial fibrillation (HCC)  I48.19 EKG 12-Lead    Basic metabolic panel    Magnesium    CBC    SARS-COV-2 RNA,(COVID-19) QUAL NAAT    2. Long term (current) use of anticoagulants  Z79.01   3. Benign hypertension  I10   4. Class 1 obesity due to excess calories with serious comorbidity and body mass index (BMI) of 34.0 to 34.9 in adult  E66.09    Z68.34      RECOMMENDATIONS: Jerry Hudson is a  69 y.o. male whose past medical history and cardiac risk factors include: Newly discovered atrial fibrillation, hypertension, history of prostate cancer status post robotic assisted laparoscopic prostatectomy with bilateral pelvic lymph node dissection repair, trigeminal neuralgia status post nerve decompression, history of varicose veins, obesity due to excess calories, advanced age.  Atrial fibrillation, persistent: . Continue Lopressor for rate control. . Continue Xarelto for thromboembolic prophylaxis. . CHA2DS2-VASc SCORE is 2 (age, HTN) which correlates to 2.2 % risk of stroke per year.  . Patient does not endorse any evidence of bleeding. . Reemphasized the risks, benefits, and alternatives to oral anticoagulation. . Since the patient remains in atrial fibrillation recommended TEE guided direct-current cardioversion to hopefully restore normal sinus rhythm.  Patient states that he will discuss it further with her as daughter and call the office back with this decision. . After careful review of history and examination, the risks, benefits of transesophageal echocardiogram, and alternatives have been explained to the patient. Complications include but not limited to esophageal perforation (rare), gastrointestinal bleeding (rare), cardiac arrhythmia which can include cardiac arrest and death (rare), pharyngeal irritation / discomfort with swallowing / hematoma, methemoglobinemia, bronchospasm, transient hypoxia, nonsustained ventricular tachycardia, transient atrial fibrillation, minimal hemoptysis, vomiting, hypotension, respiratory compromise, reaction to medications, unavoidable damage to teeth and gums, aspiration pneumonia  were reviewed with the patient.  Patient voices understands, provides verbal feedback, questions answered, and patient would like to discuss with her daughter prior to scheduling the procedure.  He will call the office back with his decision.   . Risks, benefits, and  alternatives of direct current cardioversion reviewed with the and patient. Risk includes but not limited to: potential for post-cardioversion rhythms, life-threatening arrhythmias (ventricular tachycardia and fibrillation, profound bradycardia, cardiac arrest) which may require cardiopulmonary resuscitation also known as CPR, myocardial damage, acute pulmonary edema, skin burns, transient hypotension. Benefits include restoration of sinus rhythm. Alternatives to treatment were discussed, questions were answered, patient voices understanding and provides verbal feedback.  Patient would like to discuss with her daughter prior to scheduling the procedure.  He will call the office back with his decision.  . I will place orders for a BMP, magnesium, CBC, and Covid screen.  Patient is informed to have the testing done if he wishes to proceed with elective TEE plus direct-current cardioversion.  Long-term oral anticoagulation:  Indication: Atrial fibrillation.  Reemphasized the risks, benefits, and alternatives to oral anticoagulation.  He does not endorse any evidence of bleeding.  Benign essential hypertension: Currently managed by primary care provider.  FINAL MEDICATION LIST END OF ENCOUNTER: No orders of the defined types were placed in this encounter.    Current Outpatient Medications:  .  allopurinol (ZYLOPRIM) 300 MG tablet, Take 300 mg by mouth daily., Disp: , Rfl: 0 .  hydrochlorothiazide (HYDRODIURIL) 25 MG tablet, Take 1 tablet by mouth daily., Disp: , Rfl:  .  metoprolol tartrate (LOPRESSOR) 25 MG tablet, Take 12.5 mg by mouth 2 (two) times daily as needed., Disp: , Rfl:  .  olmesartan (BENICAR) 40 MG tablet, Take 1 tablet by mouth daily., Disp: , Rfl:  .  Oxcarbazepine (TRILEPTAL) 300 MG tablet, Take 300 mg   by mouth 2 (two) times daily., Disp: , Rfl:  .  rivaroxaban (XARELTO) 20 MG TABS tablet, Take 1 tablet by mouth daily., Disp: , Rfl:   Orders Placed This Encounter  Procedures   . SARS-COV-2 RNA,(COVID-19) QUAL NAAT  . Basic metabolic panel  . Magnesium  . CBC  . EKG 12-Lead   There are no Patient Instructions on file for this visit.   --Continue cardiac medications as reconciled in final medication list. --Return in about 6 months (around 11/07/2020) for A. fib. Or sooner if needed. --Continue follow-up with your primary care physician regarding the management of your other chronic comorbid conditions.  Patient's questions and concerns were addressed to his satisfaction. He voices understanding of the instructions provided during this encounter.   This note was created using a voice recognition software as a result there may be grammatical errors inadvertently enclosed that do not reflect the nature of this encounter. Every attempt is made to correct such errors.  Andris Brothers, DO, FACC  Pager: [phone] Office: [phone]   

## 2020-05-10 NOTE — Progress Notes (Signed)
Date:  05/10/2020   ID:  Jerry Hudson, DOB May 24, 1950, MRN [id number]  PCP:  Aletha Halim., PA-C  Cardiologist:  Rex Kras, DO, [facility] (established care 12/24/2019)  Date: 05/10/20 Last Office Visit: 02/08/2020  Chief Complaint  Patient presents with  . Persistent atrial fibrillation   . Follow-up    HPI  Jerry Hudson is a 70 y.o. male who presents to the office with a chief complaint of " atrial fibrillation 54-monthfollow-up." Patient's past medical history and cardiovascular risk factors include: Newly discovered atrial fibrillation, hypertension, history of prostate cancer status post robotic assisted laparoscopic prostatectomy with bilateral pelvic lymph node dissection repair, trigeminal neuralgia status post nerve decompression, history of varicose veins, obesity due to excess calories, advanced age.  Patient is referred to the office at the request of his primary care provider for evaluation and management of atrial fibrillation.  He was scheduled for a routine colonoscopy in July 2021 and during the procedure he was noted to go into atrial fibrillation.  After the procedure patient was evaluated by his primary care provider and was started on Lopressor and Xarelto for thromboembolic prophylaxis.  He was referred to cardiology for further evaluation and management.  He continues to be on Lopressor and oral anticoagulation without any evidence of bleeding.  Ischemic evaluation including an echocardiogram and stress test results results noted below.  At the last visit we discussed undergoing TEE cardioversion.  Patient states that he would think about it and call [place] with his decision.  He presents today for 359-monthollow-up.  Patient still remains in atrial fibrillation but overall asymptomatic.  Patient states that he is considering undergoing TEE cardioversion; however, he is currently taking care of his brother.  Patient states that once his brother is back to baseline  he will call the office to have the TEE cardioversion scheduled.  Patient is tolerating metoprolol without any side effects and does not endorse any evidence of bleeding.   FUNCTIONAL STATUS: He does yard work regularly but no structured exercise program or daily routine.   ALLERGIES: No Known Allergies  MEDICATION LIST PRIOR TO VISIT: Current Meds  Medication Sig  . allopurinol (ZYLOPRIM) 300 MG tablet Take 300 mg by mouth daily.  . hydrochlorothiazide (HYDRODIURIL) 25 MG tablet Take 1 tablet by mouth daily.  . metoprolol tartrate (LOPRESSOR) 25 MG tablet Take 12.5 mg by mouth 2 (two) times daily as needed.  . Marland Kitchenlmesartan (BENICAR) 40 MG tablet Take 1 tablet by mouth daily.  . Oxcarbazepine (TRILEPTAL) 300 MG tablet Take 300 mg by mouth 2 (two) times daily.  . rivaroxaban (XARELTO) 20 MG TABS tablet Take 1 tablet by mouth daily.     PAST MEDICAL HISTORY: Past Medical History:  Diagnosis Date  . [facility]   [facility]; pt reports he's "clean" as of his Nov 2019 check-up  . Hypertension   . Numbness and tingling    in feet bilat comes and goes   . Ruptured lumbar disc    L5 ruptured, L4 deteriorated  . Trauma    great right toe laceration secondary to lawnmower accident   . Trigeminal neuralgia     PAST SURGICAL HISTORY: Past Surgical History:  Procedure Laterality Date  . KNEE ARTHROSCOPY  2006   both knees  . LYMPHADENECTOMY Bilateral 04/20/2015   Procedure: BILATERAL LYMPHADENECTOMY;  Surgeon: ThAlexis FrockMD;  Location: [facility];  Service: Urology;  Laterality: Bilateral;  . ROBOT ASSISTED LAPAROSCOPIC RADICAL PROSTATECTOMY N/A  04/20/2015   Procedure: ROBOTIC ASSISTED LAPAROSCOPIC RADICAL PROSTATECTOMY LEVEL 2;  Surgeon: Alexis Frock, MD;  Location: [facility];  Service: Urology;  Laterality: N/A;  . TRIGEMINAL NERVE DECOMPRESSION      FAMILY HISTORY: The patient family history includes Cancer in his father and mother; Diabetes in his sister.  SOCIAL  HISTORY:  The patient  reports that he has never smoked. He has never used smokeless tobacco. He reports current alcohol use. He reports that he does not use drugs.  REVIEW OF SYSTEMS: Review of Systems  Constitutional: Negative for chills and fever.  HENT: Negative for hoarse voice and nosebleeds.   Eyes: Negative for discharge, double vision and pain.  Cardiovascular: Negative for chest pain, claudication, dyspnea on exertion, leg swelling, near-syncope, orthopnea, palpitations, paroxysmal nocturnal dyspnea and syncope.  Respiratory: Negative for hemoptysis and shortness of breath.   Musculoskeletal: Negative for muscle cramps and myalgias.  Gastrointestinal: Negative for abdominal pain, constipation, diarrhea, hematemesis, hematochezia, melena, nausea and vomiting.  Neurological: Negative for light-headedness.    PHYSICAL EXAM: Vitals with BMI 05/10/2020 05/10/2020 02/08/2020  Height - _0  _1   Weight - 261 lbs 6 oz 265 lbs  BMI - 51.88 41.66  Systolic 063 016 010  Diastolic 89 92 79  Pulse 63 64 73    CONSTITUTIONAL: Well-developed and well-nourished. No acute distress.  SKIN: Skin is warm and dry. No rash noted. No cyanosis. No pallor. No jaundice HEAD: Normocephalic and atraumatic.  EYES: No scleral icterus MOUTH/THROAT: Moist oral membranes.  NECK: No JVD present. No thyromegaly noted. No carotid bruits  LYMPHATIC: No visible cervical adenopathy.  CHEST Normal respiratory effort. No intercostal retractions  LUNGS: Clear to auscultation bilaterally.  No stridor. No wheezes. No rales.  CARDIOVASCULAR: Irregularly irregular, positive X3-A3, soft holosystolic murmur heard at the apex, no gallops or rubs. ABDOMINAL: Obese, soft, nontender, nondistended, positive bowel sounds all 4 quadrants. No apparent ascites.  EXTREMITIES: No peripheral edema.  Varicose veins bilaterally. HEMATOLOGIC: No significant bruising NEUROLOGIC: Oriented to person, place, and time. Nonfocal. Normal  muscle tone.  PSYCHIATRIC: Normal mood and affect. Normal behavior. Cooperative  CARDIAC DATABASE: EKG: 12/24/2019: Atrial fibrillation, 75 bpm, poor R wave progression, low voltage. 05/10/2020: Atrial fibrillation,65bpm, without underlying injury pattern.   Echocardiogram: 01/07/2020:  Study Quality: Technically Difficult  Normal LV systolic function with visual EF 50-55%. Left ventricle cavity is normal in size. Moderate left ventricular hypertrophy. Normal global wall motion. Indeterminate LAP. Unable to evaluate diastolic function due to atrial fibrillation. Calculated EF 50%.  Left atrial cavity is mildly dilated.  Mild (Grade I) mitral regurgitation.  Mild tricuspid regurgitation. No evidence of pulmonary hypertension.  Mild pulmonic regurgitation.  No prior study for comparison.  Stress Testing: Lexiscan Tetrofosmin Stress Test 12/28/2019:  Non-diagnostic ECG stress. Resting EKG demonstrated atrial fibrillation. Non-specific T-wave abnormalities.  Peak EKG revealed no significant ST-T change from baseline abnormality.  Myocardial perfusion is normal.  Overall LV systolic function is normal without regional wall motion abnormalities. Stress LV EF: 70%.  No previous exam available for comparison. Low risk study.   Heart Catheterization: None  LABORATORY DATA: External Labs: Collected: 12/04/2019 at [facility] from care everywhere Creatinine 1.3 mg/dL. eGFR: 56 mL/min per 1.73 m Hemoglobin 13.8 g/dL TSH 1.99  IMPRESSION:    ICD-10-CM   1. Persistent atrial fibrillation (HCC)  I48.19 EKG 55-DDUK    Basic metabolic panel    Magnesium    CBC    SARS-COV-2 RNA,(COVID-19) QUAL NAAT  2. Long term (current) use of anticoagulants  Z79.01   3. Benign hypertension  I10   4. Class 1 obesity due to excess calories with serious comorbidity and body mass index (BMI) of 34.0 to 34.9 in adult  E66.09    Z68.34      RECOMMENDATIONS: Jerry Hudson is a  70 y.o. male whose past medical history and cardiac risk factors include: Newly discovered atrial fibrillation, hypertension, history of prostate cancer status post robotic assisted laparoscopic prostatectomy with bilateral pelvic lymph node dissection repair, trigeminal neuralgia status post nerve decompression, history of varicose veins, obesity due to excess calories, advanced age.  Atrial fibrillation, persistent: . Continue Lopressor for rate control. . Continue Xarelto for thromboembolic prophylaxis. . CHA2DS2-VASc SCORE is 2 (age, HTN) which correlates to 2.2 % risk of stroke per year.  . Patient does not endorse any evidence of bleeding. . Reemphasized the risks, benefits, and alternatives to oral anticoagulation. . Since the patient remains in atrial fibrillation recommended TEE guided direct-current cardioversion to hopefully restore normal sinus rhythm.  Patient states that he will discuss it further with her as daughter and call the office back with this decision. . After careful review of history and examination, the risks, benefits of transesophageal echocardiogram, and alternatives have been explained to the patient. Complications include but not limited to esophageal perforation (rare), gastrointestinal bleeding (rare), cardiac arrhythmia which can include cardiac arrest and death (rare), pharyngeal irritation / discomfort with swallowing / hematoma, methemoglobinemia, bronchospasm, transient hypoxia, nonsustained ventricular tachycardia, transient atrial fibrillation, minimal hemoptysis, vomiting, hypotension, respiratory compromise, reaction to medications, unavoidable damage to teeth and gums, aspiration pneumonia  were reviewed with the patient.  Patient voices understands, provides verbal feedback, questions answered, and patient would like to discuss with her daughter prior to scheduling the procedure.  He will call the office back with his decision.   . Risks, benefits, and  alternatives of direct current cardioversion reviewed with the and patient. Risk includes but not limited to: potential for post-cardioversion rhythms, life-threatening arrhythmias (ventricular tachycardia and fibrillation, profound bradycardia, cardiac arrest) which may require cardiopulmonary resuscitation also known as CPR, myocardial damage, acute pulmonary edema, skin burns, transient hypotension. Benefits include restoration of sinus rhythm. Alternatives to treatment were discussed, questions were answered, patient voices understanding and provides verbal feedback.  Patient would like to discuss with her daughter prior to scheduling the procedure.  He will call the office back with his decision.  . I will place orders for a BMP, magnesium, CBC, and Covid screen.  Patient is informed to have the testing done if he wishes to proceed with elective TEE plus direct-current cardioversion.  Long-term oral anticoagulation:  Indication: Atrial fibrillation.  Reemphasized the risks, benefits, and alternatives to oral anticoagulation.  He does not endorse any evidence of bleeding.  Benign essential hypertension: Currently managed by primary care provider.  FINAL MEDICATION LIST END OF ENCOUNTER: No orders of the defined types were placed in this encounter.    Current Outpatient Medications:  .  allopurinol (ZYLOPRIM) 300 MG tablet, Take 300 mg by mouth daily., Disp: , Rfl: 0 .  hydrochlorothiazide (HYDRODIURIL) 25 MG tablet, Take 1 tablet by mouth daily., Disp: , Rfl:  .  metoprolol tartrate (LOPRESSOR) 25 MG tablet, Take 12.5 mg by mouth 2 (two) times daily as needed., Disp: , Rfl:  .  olmesartan (BENICAR) 40 MG tablet, Take 1 tablet by mouth daily., Disp: , Rfl:  .  Oxcarbazepine (TRILEPTAL) 300 MG tablet, Take 300 mg  by mouth 2 (two) times daily., Disp: , Rfl:  .  rivaroxaban (XARELTO) 20 MG TABS tablet, Take 1 tablet by mouth daily., Disp: , Rfl:   Orders Placed This Encounter  Procedures   . SARS-COV-2 RNA,(COVID-19) QUAL NAAT  . Basic metabolic panel  . Magnesium  . CBC  . EKG 12-Lead   There are no Patient Instructions on file for this visit.   --Continue cardiac medications as reconciled in final medication list. --Return in about 6 months (around 11/07/2020) for A. fib. Or sooner if needed. --Continue follow-up with your primary care physician regarding the management of your other chronic comorbid conditions.  Patient's questions and concerns were addressed to his satisfaction. He voices understanding of the instructions provided during this encounter.   This note was created using a voice recognition software as a result there may be grammatical errors inadvertently enclosed that do not reflect the nature of this encounter. Every attempt is made to correct such errors.  Rex Kras, [place], [facility]  Pager: [phone] Office: [phone]

## 2020-05-19 LAB — CBC
Hematocrit: 40.6 % (ref 37.5–51.0)
Hemoglobin: 14.1 g/dL (ref 13.0–17.7)
MCH: 30 pg (ref 26.6–33.0)
MCHC: 34.7 g/dL (ref 31.5–35.7)
MCV: 86 fL (ref 79–97)
Platelets: 201 10*3/uL (ref 150–450)
RBC: 4.7 x10E6/uL (ref 4.14–5.80)
RDW: 12.7 % (ref 11.6–15.4)
WBC: 6.8 10*3/uL (ref 3.4–10.8)

## 2020-05-19 LAB — BASIC METABOLIC PANEL
BUN/Creatinine Ratio: 18 (ref 10–24)
BUN: 18 mg/dL (ref 8–27)
CO2: 21 mmol/L (ref 20–29)
Calcium: 9.4 mg/dL (ref 8.6–10.2)
Chloride: 106 mmol/L (ref 96–106)
Creatinine, Ser: 1.01 mg/dL (ref 0.76–1.27)
GFR calc Af Amer: 89 mL/min/{1.73_m2} (ref >59)
GFR calc non Af Amer: 77 mL/min/{1.73_m2} (ref >59)
Glucose: 118 mg/dL — ABNORMAL HIGH (ref 65–99)
Potassium: 4 mmol/L (ref 3.5–5.2)
Sodium: 141 mmol/L (ref 134–144)

## 2020-05-19 LAB — MAGNESIUM: Magnesium: 1.9 mg/dL (ref 1.6–2.3)

## 2020-05-20 ENCOUNTER — Other Ambulatory Visit (HOSPITAL_COMMUNITY)
Admission: RE | Admit: 2020-05-20 | Discharge: 2020-05-20 | Disposition: A | Discharge status: Home / Self Care | Payer: Medicare Other | Source: Ambulatory Visit | Attending: Cardiology | Admitting: Cardiology

## 2020-05-20 DIAGNOSIS — Z20822 Contact with and (suspected) exposure to covid-19: Secondary | ICD-10-CM | POA: Diagnosis not present

## 2020-05-20 DIAGNOSIS — Z01812 Encounter for preprocedural laboratory examination: Secondary | ICD-10-CM | POA: Insufficient documentation

## 2020-05-20 LAB — SARS CORONAVIRUS 2 (TAT 6-24 HRS): SARS Coronavirus 2: NEGATIVE

## 2020-05-23 NOTE — Anesthesia Preprocedure Evaluation (Addendum)
Anesthesia Evaluation  Patient identified by MRN, date of birth, ID band Patient awake    Reviewed: Allergy & Precautions, NPO status , Patient's Chart, lab work & pertinent test results, reviewed documented beta blocker date and time   History of Anesthesia Complications Negative for: history of anesthetic complications  Airway Mallampati: II  TM Distance: >3 FB Neck ROM: Full    Dental  (+) Dental Advisory Given   Pulmonary  05/21/2019 SARS coronavirus NEG   breath sounds clear to auscultation       Cardiovascular hypertension, Pt. on medications and Pt. on home beta blockers (-) angina+ dysrhythmias Atrial Fibrillation  Rhythm:Irregular Rate:Normal  01/2020 ECHO: EF 50-55%, mod LVH, mild MR, mild TR   Neuro/Psych H/o trigeminal neuralgia    GI/Hepatic negative GI ROS, Neg liver ROS,   Endo/Other  Morbid obesity  Renal/GU      Musculoskeletal   Abdominal (+) + obese,   Peds  Hematology xarelto   Anesthesia Other Findings   Reproductive/Obstetrics                            Anesthesia Physical Anesthesia Plan  ASA: III  Anesthesia Plan: MAC   Post-op Pain Management:    Induction:   PONV Risk Score and Plan: Treatment may vary due to age or medical condition  Airway Management Planned: Natural Airway and Nasal Cannula  Additional Equipment: None  Intra-op Plan:   Post-operative Plan:   Informed Consent: I have reviewed the patients History and Physical, chart, labs and discussed the procedure including the risks, benefits and alternatives for the proposed anesthesia with the patient or authorized representative who has indicated his/her understanding and acceptance.     Dental advisory given  Plan Discussed with: CRNA and Surgeon  Anesthesia Plan Comments: (Convert to [place] if proceed with cardioversion)      Anesthesia Quick Evaluation

## 2020-05-24 ENCOUNTER — Ambulatory Visit (HOSPITAL_COMMUNITY): Payer: Medicare Other

## 2020-05-24 ENCOUNTER — Other Ambulatory Visit: Payer: Self-pay

## 2020-05-24 ENCOUNTER — Ambulatory Visit (HOSPITAL_COMMUNITY): Payer: Medicare Other | Admitting: Certified Registered Nurse Anesthetist

## 2020-05-24 ENCOUNTER — Ambulatory Visit (HOSPITAL_COMMUNITY)
Admission: RE | Admit: 2020-05-24 | Discharge: 2020-05-24 | Disposition: A | Discharge status: Home / Self Care | Payer: Medicare Other | Attending: Cardiology | Admitting: Cardiology

## 2020-05-24 ENCOUNTER — Encounter (HOSPITAL_COMMUNITY): Payer: Self-pay | Admitting: Cardiology

## 2020-05-24 ENCOUNTER — Encounter (HOSPITAL_COMMUNITY)
Admission: RE | Disposition: A | Discharge status: Home / Self Care | Payer: Self-pay | Source: Home / Self Care | Attending: Cardiology

## 2020-05-24 DIAGNOSIS — I313 Pericardial effusion (noninflammatory): Secondary | ICD-10-CM | POA: Diagnosis not present

## 2020-05-24 DIAGNOSIS — I4819 Other persistent atrial fibrillation: Secondary | ICD-10-CM | POA: Insufficient documentation

## 2020-05-24 DIAGNOSIS — I10 Essential (primary) hypertension: Secondary | ICD-10-CM | POA: Diagnosis not present

## 2020-05-24 DIAGNOSIS — Z7901 Long term (current) use of anticoagulants: Secondary | ICD-10-CM | POA: Insufficient documentation

## 2020-05-24 DIAGNOSIS — Z6834 Body mass index (BMI) 34.0-34.9, adult: Secondary | ICD-10-CM | POA: Insufficient documentation

## 2020-05-24 DIAGNOSIS — Z79899 Other long term (current) drug therapy: Secondary | ICD-10-CM | POA: Insufficient documentation

## 2020-05-24 DIAGNOSIS — E6609 Other obesity due to excess calories: Secondary | ICD-10-CM | POA: Diagnosis not present

## 2020-05-24 HISTORY — PX: BUBBLE STUDY: SHX6837

## 2020-05-24 HISTORY — PX: CARDIOVERSION: SHX1299

## 2020-05-24 HISTORY — PX: TEE WITHOUT CARDIOVERSION: SHX5443

## 2020-05-24 LAB — ECHO TEE
S' Lateral: 2.5 cm
Single Plane A4C EF: 56.4 %

## 2020-05-24 SURGERY — ECHOCARDIOGRAM, TRANSESOPHAGEAL
Anesthesia: General

## 2020-05-24 MED ORDER — LIDOCAINE 2% (20 MG/ML) 5 ML SYRINGE
INTRAMUSCULAR | Status: DC | PRN
  Administered 2020-05-24: 80 mg via INTRAVENOUS

## 2020-05-24 MED ORDER — PROPOFOL 10 MG/ML IV BOLUS
INTRAVENOUS | Status: DC | PRN
  Administered 2020-05-24: 15 mg via INTRAVENOUS
  Administered 2020-05-24: 20 mg via INTRAVENOUS
  Administered 2020-05-24: 15 mg via INTRAVENOUS
  Administered 2020-05-24: 20 mg via INTRAVENOUS

## 2020-05-24 MED ORDER — PROPOFOL 500 MG/50ML IV EMUL
INTRAVENOUS | Status: DC | PRN
  Administered 2020-05-24: 100 ug/kg/min via INTRAVENOUS

## 2020-05-24 MED ORDER — SODIUM CHLORIDE 0.9 % IV SOLN: INTRAVENOUS | Status: DC

## 2020-05-24 MED ORDER — PHENYLEPHRINE 40 MCG/ML (10ML) SYRINGE FOR IV PUSH (FOR BLOOD PRESSURE SUPPORT)
PREFILLED_SYRINGE | INTRAVENOUS | Status: DC | PRN
  Administered 2020-05-24: 80 ug via INTRAVENOUS

## 2020-05-24 NOTE — H&P (Signed)
No change since last office visit.  SYT   Date:  05/24/2020   ID:  Jerry Hudson, DOB [birth year], MRN [id number]  PCP:  Aletha Halim., PA-C  Cardiologist:  Rex Kras, DO, [facility] (established care 12/24/2019)  Date: 05/10/20 Last Office Visit: 02/08/2020  No chief complaint on file.   HPI  Jerry Hudson is a 70 y.o. male who presents to the office with a chief complaint of " atrial fibrillation 47-month follow-up." Patient's past medical history and cardiovascular risk factors include: Newly discovered atrial fibrillation, hypertension, history of prostate cancer status post robotic assisted laparoscopic prostatectomy with bilateral pelvic lymph node dissection repair, trigeminal neuralgia status post nerve decompression, history of varicose veins, obesity due to excess calories, advanced age.  Patient is referred to the office at the request of his primary care provider for evaluation and management of atrial fibrillation.  He was scheduled for a routine colonoscopy in July 2021 and during the procedure he was noted to go into atrial fibrillation.  After the procedure patient was evaluated by his primary care provider and was started on Lopressor and Xarelto for thromboembolic prophylaxis.  He was referred to cardiology for further evaluation and management.  He continues to be on Lopressor and oral anticoagulation without any evidence of bleeding.  Ischemic evaluation including an echocardiogram and stress test results results noted below.  At the last visit we discussed undergoing TEE cardioversion.  Patient states that he would think about it and call us back with his decision.  He presents today for 50-month follow-up.  Patient still remains in atrial fibrillation but overall asymptomatic.  Patient states that he is considering undergoing TEE cardioversion; however, he is currently taking care of his brother.  Patient states that once his brother is back to baseline he will call the  office to have the TEE cardioversion scheduled.  Patient is tolerating metoprolol without any side effects and does not endorse any evidence of bleeding.   FUNCTIONAL STATUS: He does yard work regularly but no structured exercise program or daily routine.   ALLERGIES: No Known Allergies  MEDICATION LIST PRIOR TO VISIT: Current Meds  Medication Sig  . allopurinol (ZYLOPRIM) 300 MG tablet Take 300 mg by mouth daily.  . hydrochlorothiazide (HYDRODIURIL) 25 MG tablet Take 25 mg by mouth daily.  Marland Kitchen ibuprofen (ADVIL) 200 MG tablet Take 400 mg by mouth every 6 (six) hours as needed for moderate pain or headache.  . metoprolol tartrate (LOPRESSOR) 25 MG tablet Take 12.5 mg by mouth 2 (two) times daily.  Marland Kitchen olmesartan (BENICAR) 40 MG tablet Take 40 mg by mouth daily.  . Oxcarbazepine (TRILEPTAL) 300 MG tablet Take 300 mg by mouth 2 (two) times daily.  . rivaroxaban (XARELTO) 20 MG TABS tablet Take 20 mg by mouth daily with supper.  . zinc gluconate 50 MG tablet Take 50 mg by mouth daily.     PAST MEDICAL HISTORY: Past Medical History:  Diagnosis Date  . Cancer [facility])    prostate cancer; pt reports he's "clean" as of his Nov 2019 check-up  . Hypertension   . Numbness and tingling    in feet bilat comes and goes   . Ruptured lumbar disc    L5 ruptured, L4 deteriorated  . Trauma    great right toe laceration secondary to lawnmower accident   . Trigeminal neuralgia     PAST SURGICAL HISTORY: Past Surgical History:  Procedure Laterality Date  . KNEE ARTHROSCOPY  2006   both  knees  . LYMPHADENECTOMY Bilateral 04/20/2015   Procedure: BILATERAL LYMPHADENECTOMY;  Surgeon: Alexis Frock, MD;  Location: [facility]S;  Service: Urology;  Laterality: Bilateral;  . ROBOT ASSISTED LAPAROSCOPIC RADICAL PROSTATECTOMY N/A 04/20/2015   Procedure: ROBOTIC ASSISTED LAPAROSCOPIC RADICAL PROSTATECTOMY LEVEL 2;  Surgeon: Alexis Frock, MD;  Location: [facility]S;  Service: Urology;  Laterality: N/A;  . TRIGEMINAL  NERVE DECOMPRESSION      FAMILY HISTORY: The patient family history includes Cancer in his father and mother; Diabetes in his sister.  SOCIAL HISTORY:  The patient  reports that he has never smoked. He has never used smokeless tobacco. He reports current alcohol use. He reports that he does not use drugs.  REVIEW OF SYSTEMS: Review of Systems  Constitutional: Negative for chills and fever.  HENT: Negative for hoarse voice and nosebleeds.   Eyes: Negative for discharge, double vision and pain.  Cardiovascular: Negative for chest pain, claudication, dyspnea on exertion, leg swelling, near-syncope, orthopnea, palpitations, paroxysmal nocturnal dyspnea and syncope.  Respiratory: Negative for hemoptysis and shortness of breath.   Musculoskeletal: Negative for muscle cramps and myalgias.  Gastrointestinal: Negative for abdominal pain, constipation, diarrhea, hematemesis, hematochezia, melena, nausea and vomiting.  Neurological: Negative for light-headedness.    PHYSICAL EXAM: Vitals with BMI 05/24/2020 05/10/2020 05/10/2020  Height $Remov'6\' 1"'ERBIXV$  - $'6\' 1"'O$   Weight 260 lbs - 261 lbs 6 oz  BMI 62.83 - 15.17  Systolic 616 073 710  Diastolic 97 89 92  Pulse - 63 64    CONSTITUTIONAL: Well-developed and well-nourished. No acute distress.  SKIN: Skin is warm and dry. No rash noted. No cyanosis. No pallor. No jaundice HEAD: Normocephalic and atraumatic.  EYES: No scleral icterus MOUTH/THROAT: Moist oral membranes.  NECK: No JVD present. No thyromegaly noted. No carotid bruits  LYMPHATIC: No visible cervical adenopathy.  CHEST Normal respiratory effort. No intercostal retractions  LUNGS: Clear to auscultation bilaterally.  No stridor. No wheezes. No rales.  CARDIOVASCULAR: Irregularly irregular, positive G2-I9, soft holosystolic murmur heard at the apex, no gallops or rubs. ABDOMINAL: Obese, soft, nontender, nondistended, positive bowel sounds all 4 quadrants. No apparent ascites.  EXTREMITIES: No  peripheral edema.  Varicose veins bilaterally. HEMATOLOGIC: No significant bruising NEUROLOGIC: Oriented to person, place, and time. Nonfocal. Normal muscle tone.  PSYCHIATRIC: Normal mood and affect. Normal behavior. Cooperative  CARDIAC DATABASE: EKG: 12/24/2019: Atrial fibrillation, 75 bpm, poor R wave progression, low voltage. 05/10/2020: Atrial fibrillation,65bpm, without underlying injury pattern.   Echocardiogram: 01/07/2020:  Study Quality: Technically Difficult  Normal LV systolic function with visual EF 50-55%. Left ventricle cavity is normal in size. Moderate left ventricular hypertrophy. Normal global wall motion. Indeterminate LAP. Unable to evaluate diastolic function due to atrial fibrillation. Calculated EF 50%.  Left atrial cavity is mildly dilated.  Mild (Grade I) mitral regurgitation.  Mild tricuspid regurgitation. No evidence of pulmonary hypertension.  Mild pulmonic regurgitation.  No prior study for comparison.  Stress Testing: Lexiscan Tetrofosmin Stress Test 12/28/2019:  Non-diagnostic ECG stress. Resting EKG demonstrated atrial fibrillation. Non-specific T-wave abnormalities.  Peak EKG revealed no significant ST-T change from baseline abnormality.  Myocardial perfusion is normal.  Overall LV systolic function is normal without regional wall motion abnormalities. Stress LV EF: 70%.  No previous exam available for comparison. Low risk study.   Heart Catheterization: None  LABORATORY DATA: External Labs: Collected: 12/04/2019 at [facility] from care everywhere Creatinine 1.3 mg/dL. eGFR: 56 mL/min per 1.73 m Hemoglobin 13.8 g/dL TSH 1.99  IMPRESSION:  No  diagnosis found.   RECOMMENDATIONS: Jerry Hudson is a 70 y.o. male whose past medical history and cardiac risk factors include: Newly discovered atrial fibrillation, hypertension, history of prostate cancer status post robotic assisted laparoscopic prostatectomy with  bilateral pelvic lymph node dissection repair, trigeminal neuralgia status post nerve decompression, history of varicose veins, obesity due to excess calories, advanced age.  Atrial fibrillation, persistent: . Continue Lopressor for rate control. . Continue Xarelto for thromboembolic prophylaxis. . CHA2DS2-VASc SCORE is 2 (age, HTN) which correlates to 2.2 % risk of stroke per year.  . Patient does not endorse any evidence of bleeding. . Reemphasized the risks, benefits, and alternatives to oral anticoagulation. . Since the patient remains in atrial fibrillation recommended TEE guided direct-current cardioversion to hopefully restore normal sinus rhythm.  Patient states that he will discuss it further with her as daughter and call the office back with this decision. . After careful review of history and examination, the risks, benefits of transesophageal echocardiogram, and alternatives have been explained to the patient. Complications include but not limited to esophageal perforation (rare), gastrointestinal bleeding (rare), cardiac arrhythmia which can include cardiac arrest and death (rare), pharyngeal irritation / discomfort with swallowing / hematoma, methemoglobinemia, bronchospasm, transient hypoxia, nonsustained ventricular tachycardia, transient atrial fibrillation, minimal hemoptysis, vomiting, hypotension, respiratory compromise, reaction to medications, unavoidable damage to teeth and gums, aspiration pneumonia  were reviewed with the patient.  Patient voices understands, provides verbal feedback, questions answered, and patient would like to discuss with her daughter prior to scheduling the procedure.  He will call the office back with his decision.   . Risks, benefits, and alternatives of direct current cardioversion reviewed with the and patient. Risk includes but not limited to: potential for post-cardioversion rhythms, life-threatening arrhythmias (ventricular tachycardia and fibrillation,  profound bradycardia, cardiac arrest) which may require cardiopulmonary resuscitation also known as CPR, myocardial damage, acute pulmonary edema, skin burns, transient hypotension. Benefits include restoration of sinus rhythm. Alternatives to treatment were discussed, questions were answered, patient voices understanding and provides verbal feedback.  Patient would like to discuss with her daughter prior to scheduling the procedure.  He will call the office back with his decision.  . I will place orders for a BMP, magnesium, CBC, and Covid screen.  Patient is informed to have the testing done if he wishes to proceed with elective TEE plus direct-current cardioversion.  Long-term oral anticoagulation:  Indication: Atrial fibrillation.  Reemphasized the risks, benefits, and alternatives to oral anticoagulation.  He does not endorse any evidence of bleeding.  Benign essential hypertension: Currently managed by primary care provider.  FINAL MEDICATION LIST END OF ENCOUNTER: Meds ordered this encounter  Medications  . 0.9 %  sodium chloride infusion     Current Facility-Administered Medications:  .  0.9 %  sodium chloride infusion, , Intravenous, Continuous, Alphia Behanna, DO  Orders Placed This Encounter  Procedures  . Protime-INR  . Diet NPO time specified  . Informed Consent Details: Physician/Practitioner Attestation; Transcribe to consent form and obtain patient signature  . Cardiac monitoring  . Vital signs  . Measure blood pressure  . Verify informed consent  . Remove and safely store all jewelry.  Tape rings in place that cannot be removed.  . Patient to wear a single hospital gown - Ask patient to remove dentures, if any  . Positioning instruction  . Echo machine on patient's right. Verify adequate EKG signal.  . When patient fully awake discontinue Oxygen, change IV to saline lock and activity  Ad Lib - Notify physician when completed  . Notify performing physician when  patient is ready for discharge  . Pre-admission testing diagnosis  . If patient is taking diuretic or labs are older than 2 days, place order for and obtain BMET  . If patient is on Coumadin, place order for and obtain PT-INR and document INR is between 2-3  . If patient is on rivaroxaban [employer]), verify that patient currently has taken 3 consecutive doses  . If patient is on dabigatran (PRADAXA) or apixaban [facility]), verify that patient currently has taken 5 consecutive doses  . Verify informed consent  . If patient is a diabetic, place order for and obtain CBG  . Document by rhythm strip atrial fibrillation or atrial flutter. If patient is in NSR, place order for and obtain EKG, call MD  . Remove and safely store all jewelry.  Tape rings in place that cannot be removed.  . Shave left side of chest and back if necessary for pad placement  . Patient to wear a single hospital gown - Ask patient to remove dentures, if any  . Provide equipment / supplies at bedside  . Vital signs post cardioversion  . Vital signs every 4 hours x 4, then every 8 hours and PRN  . If diabetic or glucose greater than 140 notify MD to place Glycemic Control Order Set  . Advance diet as tolerated  . Informed Consent Details: Physician/Practitioner Attestation; Transcribe to consent form and obtain patient signature  . Pulse oximetry, continuous  . Oxygen therapy Mode or (Route): Nasal cannula; Liters Per Minute: 2  . EKG 12-Lead pre-cardioversion  . EKG 12-Lead  . ECHO TEE  . Insert peripheral IV  . Insert peripheral IV  . Discharge patient Discharge disposition: 01-Home or Self Care; Discharge patient date: 05/24/2020   There are no outpatient Patient Instructions on file for this admission.   --Continue cardiac medications as reconciled in final medication list. --No follow-ups on file. Or sooner if needed. --Continue follow-up with your primary care physician regarding the management of your other chronic  comorbid conditions.  Patient's questions and concerns were addressed to his satisfaction. He voices understanding of the instructions provided during this encounter.   This note was created using a voice recognition software as a result there may be grammatical errors inadvertently enclosed that do not reflect the nature of this encounter. Every attempt is made to correct such errors.  Rex Kras, [place], [facility]  Pager: [phone] Office: [phone]

## 2020-05-24 NOTE — Anesthesia Postprocedure Evaluation (Signed)
Anesthesia Post Note  Patient: Jerry Hudson  Procedure(s) Performed: TRANSESOPHAGEAL ECHOCARDIOGRAM (TEE) (N/A ) CARDIOVERSION (N/A ) BUBBLE STUDY     Patient location during evaluation: Endoscopy Anesthesia Type: General Level of consciousness: awake and alert, patient cooperative and oriented Pain management: pain level controlled Vital Signs Assessment: post-procedure vital signs reviewed and stable Respiratory status: spontaneous breathing, nonlabored ventilation and respiratory function stable Cardiovascular status: blood pressure returned to baseline and stable Postop Assessment: no apparent nausea or vomiting and able to ambulate Anesthetic complications: no   No complications documented.  Last Vitals:  Vitals:   05/24/20 0835 05/24/20 0845  BP: 104/76 108/79  Pulse: 72 71  Resp: 17 15  Temp: 36.6 C   SpO2: 98% 96%    Last Pain:  Vitals:   05/24/20 0845  TempSrc:   PainSc: 0-No pain                 Clarabelle Oscarson,E. Elizbeth Posa

## 2020-05-24 NOTE — Brief Op Note (Signed)
Transesophageal echocardiogram (TEE) : Preliminary report 05/24/20  Sedation: See anesthesia records.   TEE was performed without complications   LV: Grossly, normal EF. RV: Normal structure and function.  LA: Mild dilatation. Spontaneous echo contrast was present.  No thrombus present. Left atrial appendage: Spontaneous echo contrast was present.  No thrombus present. Inter atrial septum is intact without defect. Double contrast study negative for atrial level shunting. RA: Grossly normal.    MV: Mild Regurgitation,No stenosis, no vegetation noted.  TV: Mild Regurgitation, No stenosis, no vegetation noted. AV: No Regurgitation,No stenosis, no vegetation noted.   PV: Mild Regurgitation, No stenosis, no vegetation noted.   Thoracic and ascending aorta: mild plaque.   Final report forthcoming.  Mechele Claude [facility]  Pager: [phone] Office: [phone]  Direct current cardioversion:  Procedure: Electrical Cardioversion Indications:  Atrial Fibrillation  Procedure Details:  Consent: Risks of procedure as well as the alternatives and risks of each were explained to the (patient/caregiver).  Consent for procedure obtained.  Time Out: Verified patient identification, verified procedure, site/side was marked, verified correct patient position, special equipment/implants available, medications/allergies/relevent history reviewed, required imaging and test results available. PERFORMED.  Patient placed on cardiac monitor, pulse oximetry, supplemental oxygen as necessary.  Sedation given: See Anesthesia records.  Pacer pads placed anterior and posterior chest.  Cardioverted 1 time(s).  Cardioversion with synchronized biphasic 200J shock.  Evaluation: Findings: Post procedure EKG shows: NSR Complications: None Patient did tolerate procedure well.  Time Spent Directly with the Patient:  Rex Kras 05/24/2020, 8:33 AM

## 2020-05-24 NOTE — Brief Op Note (Signed)
Patient's wife called and update regarding the procedure results.  Continue home medications.   Rex Kras, [place], [facility]  Pager: [phone] Office: [phone]

## 2020-05-24 NOTE — Interval H&P Note (Signed)
History and Physical Interval Note:  05/24/2020 7:57 AM  Jerry Hudson  has presented today for surgery, with the diagnosis of AFIB.  The various methods of treatment have been discussed with the patient and family. After consideration of risks, benefits and other options for treatment, the patient has consented to  Procedure(s): TRANSESOPHAGEAL ECHOCARDIOGRAM (TEE) (N/A) CARDIOVERSION (N/A) as a surgical intervention.  The patient's history has been reviewed, patient examined, no change in status, stable for surgery.  I have reviewed the patient's chart and labs.  Questions were answered to the patient's satisfaction.     [employer]

## 2020-05-24 NOTE — Progress Notes (Signed)
  Echocardiogram Echocardiogram Transesophageal has been performed.  Fidel Levy 05/24/2020, 8:53 AM

## 2020-05-24 NOTE — Transfer of Care (Signed)
Immediate Anesthesia Transfer of Care Note  Patient: Jerry Hudson  Procedure(s) Performed: TRANSESOPHAGEAL ECHOCARDIOGRAM (TEE) (N/A ) CARDIOVERSION (N/A ) BUBBLE STUDY  Patient Location: Endoscopy Unit  Anesthesia Type:General  Level of Consciousness: awake, alert  and drowsy  Airway & Oxygen Therapy: Patient Spontanous Breathing and Patient connected to nasal cannula oxygen  Post-op Assessment: Report given to RN, Post -op Vital signs reviewed and stable and Patient moving all extremities  Post vital signs: Reviewed and stable  Last Vitals:  Vitals Value Taken Time  BP 104/76 05/24/20 0835  Temp    Pulse 69 05/24/20 0835  Resp 10 05/24/20 0835  SpO2 97 % 05/24/20 0835  Vitals shown include unvalidated device data.  Last Pain:  Vitals:   05/24/20 0646  TempSrc: Oral  PainSc: 0-No pain         Complications: No complications documented.

## 2020-05-24 NOTE — Discharge Instructions (Signed)

## 2020-05-24 NOTE — Anesthesia Procedure Notes (Signed)
Procedure Name: MAC Date/Time: 05/24/2020 8:05 AM Performed by: Rande Brunt, CRNA Pre-anesthesia Checklist: Patient identified, Emergency Drugs available, Suction available, Patient being monitored and Timeout performed Patient Re-evaluated:Patient Re-evaluated prior to induction Oxygen Delivery Method: Nasal cannula Preoxygenation: Pre-oxygenation with 100% oxygen Induction Type: IV induction Placement Confirmation: positive ETCO2 Dental Injury: Teeth and Oropharynx as per pre-operative assessment

## 2020-05-25 ENCOUNTER — Encounter (HOSPITAL_COMMUNITY): Payer: Self-pay | Admitting: Cardiology

## 2020-08-08 ENCOUNTER — Other Ambulatory Visit: Payer: Self-pay | Admitting: Surgery

## 2020-08-08 DIAGNOSIS — K432 Incisional hernia without obstruction or gangrene: Secondary | ICD-10-CM

## 2020-08-22 DIAGNOSIS — M109 Gout, unspecified: Secondary | ICD-10-CM | POA: Insufficient documentation

## 2020-10-31 ENCOUNTER — Encounter: Payer: Self-pay | Admitting: Cardiology

## 2020-10-31 ENCOUNTER — Ambulatory Visit: Payer: Medicare Other | Admitting: Cardiology

## 2020-10-31 ENCOUNTER — Other Ambulatory Visit: Payer: Self-pay

## 2020-10-31 VITALS — BP 130/83 | HR 71 | Temp 98.0°F | Resp 16 | Ht 73.0 in | Wt 255.0 lb

## 2020-10-31 DIAGNOSIS — I4819 Other persistent atrial fibrillation: Secondary | ICD-10-CM

## 2020-10-31 DIAGNOSIS — E6609 Other obesity due to excess calories: Secondary | ICD-10-CM

## 2020-10-31 DIAGNOSIS — I10 Essential (primary) hypertension: Secondary | ICD-10-CM

## 2020-10-31 DIAGNOSIS — Z7901 Long term (current) use of anticoagulants: Secondary | ICD-10-CM

## 2020-10-31 NOTE — Progress Notes (Signed)
Date:  10/31/2020   ID:  Jerry Hudson, DOB 03-Jan-1951, MRN [id number]  PCP:  Aletha Halim., PA-C  Cardiologist:  Rex Kras, DO, [facility] (established care 12/24/2019)  Date: 10/31/20 Last Office Visit: 05/10/2020  Chief Complaint  Patient presents with   Persistent atrial fibrillation    Follow-up    HPI  Jerry Hudson is a 70 y.o. male who presents to the office with a chief complaint of " 68-month follow-up for A. fib management." Patient's past medical history and cardiovascular risk factors include: Persistent atrial fibrillation, hypertension, history of prostate cancer status post robotic assisted laparoscopic prostatectomy with bilateral pelvic lymph node dissection repair, trigeminal neuralgia status post nerve decompression, history of varicose veins, obesity due to excess calories, advanced age.  Patient is referred to the office at the request of his primary care provider for evaluation and management of atrial fibrillation.  In July 2021 he was scheduled for routine colonoscopy and was noted to be in atrial fibrillation.  After the procedure his PCP started him on Lopressor and Xarelto for thromboembolic prophylaxis.  And was referred to cardiology for further evaluation and management.  Initially decided to continue with conservative management; however, since she remained in A. fib the shared decision was to proceed with TEE cardioversion.  In January 2022 he underwent TEE cardioversion successfully converted to normal sinus rhythm with 200 J x 1.  He now presents for 20-month follow-up and based on today's EKG is back in A. fib.  Clinically patient denies any chest pain or shortness of breath at rest or with effort related activities.  He does complain of feeling tired and fatigue but this has been chronic and stable.  No hospitalizations or urgent care visits for cardiovascular symptoms since last office encounter.  FUNCTIONAL STATUS: He does yard work regularly but no  structured exercise program or daily routine.   ALLERGIES: No Known Allergies  MEDICATION LIST PRIOR TO VISIT: Current Meds  Medication Sig   allopurinol (ZYLOPRIM) 300 MG tablet Take 300 mg by mouth daily.   hydrochlorothiazide (HYDRODIURIL) 25 MG tablet Take 25 mg by mouth daily.   ibuprofen (ADVIL) 200 MG tablet Take 400 mg by mouth every 6 (six) hours as needed for moderate pain or headache.   metoprolol tartrate (LOPRESSOR) 25 MG tablet Take 12.5 mg by mouth 2 (two) times daily.   olmesartan (BENICAR) 40 MG tablet Take 40 mg by mouth daily.   Oxcarbazepine (TRILEPTAL) 300 MG tablet Take 300 mg by mouth 2 (two) times daily.   rivaroxaban (XARELTO) 20 MG TABS tablet Take 20 mg by mouth daily with supper.   zinc gluconate 50 MG tablet Take 50 mg by mouth daily.     PAST MEDICAL HISTORY: Past Medical History:  Diagnosis Date   Cancer [facility])    prostate cancer; pt reports he's "clean" as of his Nov 2019 check-up   Hypertension    Numbness and tingling    in feet bilat comes and goes    Ruptured lumbar disc    L5 ruptured, L4 deteriorated   Trauma    great right toe laceration secondary to lawnmower accident    Trigeminal neuralgia     PAST SURGICAL HISTORY: Past Surgical History:  Procedure Laterality Date   BUBBLE STUDY  05/24/2020   Procedure: BUBBLE STUDY;  Surgeon: Rex Kras, DO;  Location: [facility];  Service: Cardiovascular;;   CARDIOVERSION N/A 05/24/2020   Procedure: CARDIOVERSION;  Surgeon: Rex Kras, DO;  Location: [facility]  ENDOSCOPY;  Service: Cardiovascular;  Laterality: N/A;   KNEE ARTHROSCOPY  2006   both knees   LYMPHADENECTOMY Bilateral 04/20/2015   Procedure: BILATERAL LYMPHADENECTOMY;  Surgeon: Alexis Frock, MD;  Location: [facility]S;  Service: Urology;  Laterality: Bilateral;   ROBOT ASSISTED LAPAROSCOPIC RADICAL PROSTATECTOMY N/A 04/20/2015   Procedure: ROBOTIC ASSISTED LAPAROSCOPIC RADICAL PROSTATECTOMY LEVEL 2;  Surgeon: Alexis Frock, MD;  Location:  [facility] ORS;  Service: Urology;  Laterality: N/A;   TEE WITHOUT CARDIOVERSION N/A 05/24/2020   Procedure: TRANSESOPHAGEAL ECHOCARDIOGRAM (TEE);  Surgeon: Rex Kras, DO;  Location: [facility] ENDOSCOPY;  Service: Cardiovascular;  Laterality: N/A;   TRIGEMINAL NERVE DECOMPRESSION      FAMILY HISTORY: The patient family history includes Cancer in his father and mother; Diabetes in his sister.  SOCIAL HISTORY:  The patient  reports that he has never smoked. He has never used smokeless tobacco. He reports current alcohol use. He reports that he does not use drugs.  REVIEW OF SYSTEMS: Review of Systems  Constitutional: Negative for chills and fever.  HENT:  Negative for hoarse voice and nosebleeds.   Eyes:  Negative for discharge, double vision and pain.  Cardiovascular:  Negative for chest pain, claudication, dyspnea on exertion, leg swelling, near-syncope, orthopnea, palpitations, paroxysmal nocturnal dyspnea and syncope.  Respiratory:  Negative for hemoptysis and shortness of breath.   Musculoskeletal:  Negative for muscle cramps and myalgias.  Gastrointestinal:  Negative for abdominal pain, constipation, diarrhea, hematemesis, hematochezia, melena, nausea and vomiting.  Neurological:  Negative for light-headedness.   PHYSICAL EXAM: Vitals with BMI 10/31/2020 05/24/2020 05/24/2020  Height $Remov'6\' 1"'QoyoEw$  - -  Weight 255 lbs - -  BMI 62.37 - -  Systolic 628 315 176  Diastolic 83 79 76  Pulse 71 71 72    CONSTITUTIONAL: Well-developed and well-nourished. No acute distress.  SKIN: Skin is warm and dry. No rash noted. No cyanosis. No pallor. No jaundice HEAD: Normocephalic and atraumatic.  EYES: No scleral icterus MOUTH/THROAT: Moist oral membranes.  NECK: No JVD present. No thyromegaly noted. No carotid bruits  LYMPHATIC: No visible cervical adenopathy.  CHEST Normal respiratory effort. No intercostal retractions  LUNGS: Clear to auscultation bilaterally.  No stridor. No wheezes. No rales.   CARDIOVASCULAR: Irregularly irregular, positive H6-W7, soft holosystolic murmur heard at the apex, no gallops or rubs. ABDOMINAL: Obese, soft, nontender, nondistended, positive bowel sounds all 4 quadrants. No apparent ascites.  EXTREMITIES: No peripheral edema.  Varicose veins bilaterally. HEMATOLOGIC: No significant bruising NEUROLOGIC: Oriented to person, place, and time. Nonfocal. Normal muscle tone.  PSYCHIATRIC: Normal mood and affect. Normal behavior. Cooperative  CARDIAC DATABASE: EKG: 12/24/2019: Atrial fibrillation, 75 bpm, poor R wave progression, low voltage. 05/10/2020: Atrial fibrillation,65bpm, without underlying injury pattern.  10/31/2020: Atrial fibrillation, rate controlled at 60 bpm, without underlying injury pattern.    Echocardiogram: 01/07/2020:  Study Quality: Technically Difficult  Normal LV systolic function with visual EF 50-55%. Left ventricle cavity is normal in size. Moderate left ventricular hypertrophy. Normal global wall motion. Indeterminate LAP. Unable to evaluate diastolic function due to atrial fibrillation. Calculated EF 50%.  Left atrial cavity is mildly dilated.  Mild (Grade I) mitral regurgitation.  Mild tricuspid regurgitation. No evidence of pulmonary hypertension.  Mild pulmonic regurgitation.  No prior study for comparison.  TEE 05/24/2020: LVEF 55-60%, normal right ventricular size and function, no left atrial thrombus.  Left atrial appendage emptying velocity 39 cm/s, small pericardial effusion, mild MR, agitated saline study negative for PFO.  Stress Testing: Lexiscan Tetrofosmin Stress Test  12/28/2019:  Non-diagnostic ECG stress. Resting EKG demonstrated atrial fibrillation. Non-specific T-wave abnormalities.  Peak EKG revealed no significant ST-T change from baseline abnormality.  Myocardial perfusion is normal.  Overall LV systolic function is normal without regional wall motion abnormalities. Stress LV EF: 70%.  No previous exam  available for comparison. Low risk study.   Heart Catheterization: None  Direct-current cardioversion: 05/24/2020: 200 J of synchronized x1 converted to normal sinus rhythm.  LABORATORY DATA: External Labs: Collected: 12/04/2019 at [facility] from care everywhere Creatinine 1.3 mg/dL. eGFR: 56 mL/min per 1.73 m Hemoglobin 13.8 g/dL TSH 1.99  Collected: 08/22/2020 at [facility] available in [place]. Hemoglobin 14.7 g/dL. Hematocrit 44.5% Serum creatinine 1.01 mg/dL. AST 13, ALT 11, alkaline phosphatase 92 (within normal limits. GFR: >60. Hemoglobin A1c 6.3 Total cholesterol 155, triglycerides 124, HDL 39, LDL 96, non-HDL 116  IMPRESSION:    ICD-10-CM   1. Persistent atrial fibrillation (HCC)  I48.19 EKG 12-Lead    2. Long term (current) use of anticoagulants  Z79.01     3. Benign hypertension  I10     4. Class 1 obesity due to excess calories with serious comorbidity and body mass index (BMI) of 33.0 to 33.9 in adult  E66.09    Z68.33        RECOMMENDATIONS: Jerry Hudson is a 70 y.o. male whose past medical history and cardiac risk factors include: Newly discovered atrial fibrillation, hypertension, history of prostate cancer status post robotic assisted laparoscopic prostatectomy with bilateral pelvic lymph node dissection repair, trigeminal neuralgia status post nerve decompression, history of varicose veins, obesity due to excess calories, advanced age.  Atrial fibrillation, persistent: Continue Lopressor for rate control. Continue Xarelto for thromboembolic prophylaxis. CHA2DS2-VASc SCORE is 2 (age, HTN) which correlates to 2.2 % risk of stroke per year.  Status post TEE cardioversion January 2022. EKG today shows rate controlled atrial fibrillation as noted above. Patient does not endorse any evidence of bleeding. Reemphasized the risks, benefits, and alternatives to oral anticoagulation. Had a detailed  discussion with the patient with regards to management of atrial fibrillation.  We could consider antiarrhythmic medications after a coronary calcium score to rule out CAD.  If patient restored normal sinus rhythm after antiarrhythmic medications no additional testing or procedures needed.  However, if he remains in A. fib despite being on antiarrhythmic medications would recommend cardioversion 1 more time.  Alternative is seeing cardiac electrophysiology to see if he is a candidate for atrial fibrillation ablation.  Patient voices that he would like to avoid additional medications long-term if possible. He will discuss it further with his wife and daughter at home.  I have asked him to call the office if any questions or concerns that may arise after the discussion.  Long-term oral anticoagulation: Indication: Atrial fibrillation. Reemphasized the risks, benefits, and alternatives to oral anticoagulation. Patient had labs in April 2022 at [facility].  His creatinine clearance is well above 50 mL/minute. He does not endorse any evidence of bleeding.  Benign essential hypertension: Currently managed by primary care provider.  FINAL MEDICATION LIST END OF ENCOUNTER: No orders of the defined types were placed in this encounter.    Current Outpatient Medications:    allopurinol (ZYLOPRIM) 300 MG tablet, Take 300 mg by mouth daily., Disp: , Rfl: 0   hydrochlorothiazide (HYDRODIURIL) 25 MG tablet, Take 25 mg by mouth daily., Disp: , Rfl:    ibuprofen (ADVIL) 200 MG tablet,  Take 400 mg by mouth every 6 (six) hours as needed for moderate pain or headache., Disp: , Rfl:    metoprolol tartrate (LOPRESSOR) 25 MG tablet, Take 12.5 mg by mouth 2 (two) times daily., Disp: , Rfl:    olmesartan (BENICAR) 40 MG tablet, Take 40 mg by mouth daily., Disp: , Rfl:    Oxcarbazepine (TRILEPTAL) 300 MG tablet, Take 300 mg by mouth 2 (two) times daily., Disp: , Rfl:    rivaroxaban (XARELTO) 20  MG TABS tablet, Take 20 mg by mouth daily with supper., Disp: , Rfl:    zinc gluconate 50 MG tablet, Take 50 mg by mouth daily., Disp: , Rfl:   Orders Placed This Encounter  Procedures   EKG 12-Lead   There are no Patient Instructions on file for this visit.   --Continue cardiac medications as reconciled in final medication list. --Return in about 3 months (around 01/31/2021) for Follow up, A. fib. Or sooner if needed. --Continue follow-up with your primary care physician regarding the management of your other chronic comorbid conditions.  Patient's questions and concerns were addressed to his satisfaction. He voices understanding of the instructions provided during this encounter.   This note was created using a voice recognition software as a result there may be grammatical errors inadvertently enclosed that do not reflect the nature of this encounter. Every attempt is made to correct such errors.  Rex Kras, [place], [facility]  Pager: [phone] Office: [phone]

## 2020-11-08 ENCOUNTER — Ambulatory Visit: Payer: Medicare Other | Admitting: Cardiology

## 2021-02-01 ENCOUNTER — Other Ambulatory Visit: Payer: Self-pay

## 2021-02-01 ENCOUNTER — Encounter: Payer: Self-pay | Admitting: Cardiology

## 2021-02-01 ENCOUNTER — Ambulatory Visit: Payer: Medicare Other | Admitting: Cardiology

## 2021-02-01 VITALS — BP 135/78 | HR 55 | Temp 97.3°F | Resp 16 | Ht 73.0 in | Wt 257.0 lb

## 2021-02-01 DIAGNOSIS — I10 Essential (primary) hypertension: Secondary | ICD-10-CM

## 2021-02-01 DIAGNOSIS — E782 Mixed hyperlipidemia: Secondary | ICD-10-CM

## 2021-02-01 DIAGNOSIS — E6609 Other obesity due to excess calories: Secondary | ICD-10-CM

## 2021-02-01 DIAGNOSIS — I4819 Other persistent atrial fibrillation: Secondary | ICD-10-CM

## 2021-02-01 DIAGNOSIS — Z7901 Long term (current) use of anticoagulants: Secondary | ICD-10-CM

## 2021-02-01 NOTE — Progress Notes (Signed)
Date:  02/01/2021   ID:  Jerry Hudson, DOB 07-02-1950, MRN [id number]  PCP:  Jerry Hudson., PA-C  Cardiologist:  Jerry Kras, DO, [facility] (established care 12/24/2019)  Date: 02/01/21 Last Office Visit: 10/31/2020  Chief Complaint  Patient presents with   Persistent atrial fibrillation    Follow-up    HPI  Jerry Hudson is a 70 y.o. male who presents to the office with a chief complaint of " management of persistent atrial fibrillation." Patient's past medical history and cardiovascular risk factors include: Persistent atrial fibrillation, hypertension, history of prostate cancer status post robotic assisted laparoscopic prostatectomy with bilateral pelvic lymph node dissection repair, trigeminal neuralgia status post nerve decompression, history of varicose veins, obesity due to excess calories, advanced age.  Patient is referred to the office at the request of his primary care provider for evaluation and management of atrial fibrillation.  In July 2021 he underwent a routine colonoscopy and was found to be in atrial fibrillation.  He followed up with PCP and was started on Lopressor and Xarelto for thromboembolic prophylaxis.  He was referred to cardiology for further evaluation and management.  At that time the shared decision was to continue conservative management with rate control strategy.  However, on follow-up visits because he remained in atrial fibrillation the shared decision was to proceed with TEE guided cardioversion.  Normal sinus rhythm was obtained but shortly thereafter by the time he followed up he was back in A. fib.  At the last office visit we discussed initiating antiarrhythmic medications; however, he wanted to discuss it further with family prior to making that decision.  He is now here for follow-up.  Since last office visit patient states that he is doing well from a cardiovascular standpoint.  No hospitalizations or urgent care visits for cardiovascular  symptoms.  With regards to atrial fibrillation he would like to continue current medical therapy and does not want to proceed with antiarrhythmic medications or EP evaluation for possible radiofrequency ablation to restore normal sinus rhythm.  FUNCTIONAL STATUS: He does yard work regularly but no structured exercise program or daily routine.   ALLERGIES: No Known Allergies  MEDICATION LIST PRIOR TO VISIT: Current Meds  Medication Sig   allopurinol (ZYLOPRIM) 300 MG tablet Take 300 mg by mouth daily.   hydrochlorothiazide (HYDRODIURIL) 25 MG tablet Take 25 mg by mouth daily.   ibuprofen (ADVIL) 200 MG tablet Take 400 mg by mouth every 6 (six) hours as needed for moderate pain or headache.   metoprolol tartrate (LOPRESSOR) 25 MG tablet Take 12.5 mg by mouth 2 (two) times daily.   olmesartan (BENICAR) 40 MG tablet Take 40 mg by mouth daily.   Oxcarbazepine (TRILEPTAL) 300 MG tablet Take 300 mg by mouth 2 (two) times daily.   rivaroxaban (XARELTO) 20 MG TABS tablet Take 20 mg by mouth daily with supper.   zinc gluconate 50 MG tablet Take 50 mg by mouth daily.     PAST MEDICAL HISTORY: Past Medical History:  Diagnosis Date   Cancer [facility])    prostate cancer; pt reports he's "clean" as of his Nov 2019 check-up   Hypertension    Numbness and tingling    in feet bilat comes and goes    Ruptured lumbar disc    L5 ruptured, L4 deteriorated   Trauma    great right toe laceration secondary to lawnmower accident    Trigeminal neuralgia     PAST SURGICAL HISTORY: Past Surgical History:  Procedure  Laterality Date   BUBBLE STUDY  05/24/2020   Procedure: BUBBLE STUDY;  Surgeon: Jerry Kras, DO;  Location: [place];  Service: Cardiovascular;;   CARDIOVERSION N/A 05/24/2020   Procedure: CARDIOVERSION;  Surgeon: Jerry Kras, DO;  Location: [place];  Service: Cardiovascular;  Laterality: N/A;   KNEE ARTHROSCOPY  2006   both knees   LYMPHADENECTOMY Bilateral 04/20/2015   Procedure:  BILATERAL LYMPHADENECTOMY;  Surgeon: Alexis Frock, MD;  Location: [facility];  Service: Urology;  Laterality: Bilateral;   ROBOT ASSISTED LAPAROSCOPIC RADICAL PROSTATECTOMY N/A 04/20/2015   Procedure: ROBOTIC ASSISTED LAPAROSCOPIC RADICAL PROSTATECTOMY LEVEL 2;  Surgeon: Alexis Frock, MD;  Location: [facility];  Service: Urology;  Laterality: N/A;   TEE WITHOUT CARDIOVERSION N/A 05/24/2020   Procedure: TRANSESOPHAGEAL ECHOCARDIOGRAM (TEE);  Surgeon: Jerry Kras, DO;  [facility]: [facility];  Service: Cardiovascular;  Laterality: N/A;   TRIGEMINAL NERVE DECOMPRESSION      FAMILY HISTORY: The patient family history includes Cancer in his father and mother; Diabetes in his sister.  SOCIAL HISTORY:  The patient  reports that he has never smoked. He has never used smokeless tobacco. He reports current alcohol use. He reports that he does not use drugs.  REVIEW OF SYSTEMS: Review of Systems  Constitutional: Negative for chills and fever.  HENT:  Negative for hoarse voice and nosebleeds.   Eyes:  Negative for discharge, double vision and pain.  Cardiovascular:  Negative for chest pain, claudication, dyspnea on exertion, leg swelling, near-syncope, orthopnea, palpitations, paroxysmal nocturnal dyspnea and syncope.  Respiratory:  Negative for hemoptysis and shortness of breath.   Musculoskeletal:  Negative for muscle cramps and myalgias.  Gastrointestinal:  Negative for abdominal pain, constipation, diarrhea, hematemesis, hematochezia, melena, nausea and vomiting.  Neurological:  Negative for light-headedness.   PHYSICAL EXAM: Vitals with BMI 02/01/2021 10/31/2020 05/24/2020  Height $Remov'6\' 1"'EjTLGR$  $Remove'6\' 1"'xgXlQei$  -  Weight 257 lbs 255 lbs -  BMI 59.16 38.46 -  Systolic 659 935 701  Diastolic 78 83 79  Pulse 55 71 71    CONSTITUTIONAL: Well-developed and well-nourished. No acute distress.  SKIN: Skin is warm and dry. No rash noted. No cyanosis. No pallor. No jaundice HEAD: Normocephalic and atraumatic.  EYES: No  scleral icterus MOUTH/THROAT: Moist oral membranes.  NECK: No JVD present. No thyromegaly noted. No carotid bruits  LYMPHATIC: No visible cervical adenopathy.  CHEST Normal respiratory effort. No intercostal retractions  LUNGS: Clear to auscultation bilaterally.  No stridor. No wheezes. No rales.  CARDIOVASCULAR: Irregularly irregular, positive X7-L3, soft holosystolic murmur heard at the apex, no gallops or rubs. ABDOMINAL: Obese, soft, nontender, nondistended, positive bowel sounds all 4 quadrants. No apparent ascites.  EXTREMITIES: No peripheral edema.  Varicose veins bilaterally. HEMATOLOGIC: No significant bruising NEUROLOGIC: Oriented to person, place, and time. Nonfocal. Normal muscle tone.  PSYCHIATRIC: Normal mood and affect. Normal behavior. Cooperative  CARDIAC DATABASE: EKG: 12/24/2019: Atrial fibrillation, 75 bpm, poor R wave progression, low voltage. 05/10/2020: Atrial fibrillation,65bpm, without underlying injury pattern.  10/31/2020: Atrial fibrillation, rate controlled at 60 bpm, without underlying injury pattern.    Echocardiogram: 01/07/2020:  Study Quality: Technically Difficult  Normal LV systolic function with visual EF 50-55%. Left ventricle cavity is normal in size. Moderate left ventricular hypertrophy. Normal global wall motion. Indeterminate LAP. Unable to evaluate diastolic function due to atrial fibrillation. Calculated EF 50%.  Left atrial cavity is mildly dilated.  Mild (Grade I) mitral regurgitation.  Mild tricuspid regurgitation. No evidence of pulmonary hypertension.  Mild pulmonic regurgitation.  No prior  study for comparison.  TEE 05/24/2020: LVEF 55-60%, normal right ventricular size and function, no left atrial thrombus.  Left atrial appendage emptying velocity 39 cm/s, small pericardial effusion, mild MR, agitated saline study negative for PFO.  Stress Testing: Lexiscan Tetrofosmin Stress Test  12/28/2019:  Non-diagnostic ECG stress. Resting EKG  demonstrated atrial fibrillation. Non-specific T-wave abnormalities.  Peak EKG revealed no significant ST-T change from baseline abnormality.  Myocardial perfusion is normal.  Overall LV systolic function is normal without regional wall motion abnormalities. Stress LV EF: 70%.  No previous exam available for comparison. Low risk study.   Heart Catheterization: None  Direct-current cardioversion: 05/24/2020: 200 J of synchronized x1 converted to normal sinus rhythm.  LABORATORY DATA: External Labs: Collected: 12/04/2019 at [facility] from care everywhere Creatinine 1.3 mg/dL. eGFR: 56 mL/min per 1.73 m Hemoglobin 13.8 g/dL TSH 1.99  Collected: 08/22/2020 at [facility] available in [place]. Hemoglobin 14.7 g/dL. Hematocrit 44.5% Serum creatinine 1.01 mg/dL. AST 13, ALT 11, alkaline phosphatase 92 (within normal limits. GFR: >60. Hemoglobin A1c 6.3 Total cholesterol 155, triglycerides 124, HDL 39, LDL 96, non-HDL 116  IMPRESSION:    ICD-10-CM   1. Persistent atrial fibrillation (HCC)  I48.19 CT CARDIAC SCORING ([facility] LOCATIONS ONLY)    2. Long term (current) use of anticoagulants  Z79.01     3. Benign hypertension  I10     4. Class 1 obesity due to excess calories with serious comorbidity and body mass index (BMI) of 33.0 to 33.9 in adult  E66.09    Z68.33     5. Mixed hyperlipidemia  E78.2        RECOMMENDATIONS: LEVERETT CAMPLIN is a 70 y.o. male whose past medical history and cardiac risk factors include: Newly discovered atrial fibrillation, hypertension, history of prostate cancer status post robotic assisted laparoscopic prostatectomy with bilateral pelvic lymph node dissection repair, trigeminal neuralgia status post nerve decompression, history of varicose veins, obesity due to excess calories, advanced age.  Persistent atrial fibrillation (HCC) Rate control: Lopressor. Rhythm control: N/A. Thromboembolic  prophylaxis: Xarelto. CHA2DS2-VASc SCORE is 2 which correlates to 2.2% risk of stroke per year (age, hypertension). S/P TEE guided cardioversion (05/2020): Restored normal sinus rhythm but shortly thereafter went back into A. fib. At the last visit we discussed initiating antiarrhythmic medications versus seeing EP for possible catheter directed atrial fibrillation ablation.  After discussing with family patient states that he would like to hold off on additional medical therapy for atrial fibrillation management at this time. I will order coronary calcium score to evaluate for subclinical CAD which will help to determine which antiarrhythmic medications would be ideal for the patient if he were to choose to consider it in the near future.  He is agreeable with the plan of care.  Long term (current) use of anticoagulants Indication: Persistent atrial fibrillation Currently on Xarelto Patient does not endorse any obvious bleeding. Also reviewed with the patient the risks, benefits, and alternatives of oral anticoagulation.  Benign hypertension Office blood pressures are well controlled. Medications reconciled. Currently managed by primary care provider.  Class 1 obesity due to excess calories with serious comorbidity and body mass index (BMI) of 33.0 to 33.9 in adult Body mass index is 33.91 kg/m. Reviewed the importance of diet, regular physical activity/exercise, weight loss.   Educated on increasing physical activity gradually as tolerated.  With the goal of moderate intensity exercise for 30 minutes a day 5 days a week.  FINAL  MEDICATION LIST END OF ENCOUNTER: No orders of the defined types were placed in this encounter.    Current Outpatient Medications:    allopurinol (ZYLOPRIM) 300 MG tablet, Take 300 mg by mouth daily., Disp: , Rfl: 0   hydrochlorothiazide (HYDRODIURIL) 25 MG tablet, Take 25 mg by mouth daily., Disp: , Rfl:    ibuprofen (ADVIL) 200 MG tablet, Take 400 mg by mouth  every 6 (six) hours as needed for moderate pain or headache., Disp: , Rfl:    metoprolol tartrate (LOPRESSOR) 25 MG tablet, Take 12.5 mg by mouth 2 (two) times daily., Disp: , Rfl:    olmesartan (BENICAR) 40 MG tablet, Take 40 mg by mouth daily., Disp: , Rfl:    Oxcarbazepine (TRILEPTAL) 300 MG tablet, Take 300 mg by mouth 2 (two) times daily., Disp: , Rfl:    rivaroxaban (XARELTO) 20 MG TABS tablet, Take 20 mg by mouth daily with supper., Disp: , Rfl:    zinc gluconate 50 MG tablet, Take 50 mg by mouth daily., Disp: , Rfl:   Orders Placed This Encounter  Procedures   CT CARDIAC SCORING ([facility] LOCATIONS ONLY)   There are no Patient Instructions on file for this visit.   --Continue cardiac medications as reconciled in final medication list. --Return in about 6 months (around 08/01/2021) for Follow up, A. fib, EKG on arrival. . Or sooner if needed. --Continue follow-up with your primary care physician regarding the management of your other chronic comorbid conditions.  Patient's questions and concerns were addressed to his satisfaction. He voices understanding of the instructions provided during this encounter.   This note was created using a voice recognition software as a result there may be grammatical errors inadvertently enclosed that do not reflect the nature of this encounter. Every attempt is made to correct such errors.  Total time spent: 31 minutes  Mechele Claude [facility]  Pager: [phone] Office: [phone]

## 2021-08-02 ENCOUNTER — Encounter: Payer: Self-pay | Admitting: Cardiology

## 2021-08-02 ENCOUNTER — Other Ambulatory Visit: Payer: Self-pay

## 2021-08-02 ENCOUNTER — Ambulatory Visit: Payer: Medicare Other | Admitting: Cardiology

## 2021-08-02 VITALS — BP 148/77 | HR 64 | Temp 97.4°F | Resp 16 | Ht 73.0 in | Wt 265.0 lb

## 2021-08-02 DIAGNOSIS — I10 Essential (primary) hypertension: Secondary | ICD-10-CM

## 2021-08-02 DIAGNOSIS — E782 Mixed hyperlipidemia: Secondary | ICD-10-CM

## 2021-08-02 DIAGNOSIS — I4819 Other persistent atrial fibrillation: Secondary | ICD-10-CM

## 2021-08-02 DIAGNOSIS — Z7901 Long term (current) use of anticoagulants: Secondary | ICD-10-CM

## 2021-08-02 NOTE — Progress Notes (Signed)
? ?Date:  08/02/2021  ? ?ID:  Jerry Hudson, DOB Jun 04, 1950, MRN [id number] ? ?PCP:  Aletha Halim., PA-C  ?Cardiologist:  Rex Kras, DO, [facility] (established care 12/24/2019) ? ?Date: 08/02/21 ?Last Office Visit: 02/01/2021 ? ?Chief Complaint  ?Patient presents with  ? Atrial Fibrillation  ? Follow-up  ? ? ?HPI  ?Jerry Hudson is a 71 y.o. male whose past medical history and cardiovascular risk factors include: Persistent atrial fibrillation, hypertension, history of prostate cancer status post robotic assisted laparoscopic prostatectomy with bilateral pelvic lymph node dissection repair, trigeminal neuralgia status post nerve decompression, history of varicose veins, obesity due to excess calories, advanced age. ? ?Initially referred to the practice for evaluation and management of atrial fibrillation.  In July 2021, he went for routine colonoscopy and was found to be in A-fib.  Initially started on rate control strategy with beta-blockers and Xarelto for thromboembolic prophylaxis.  Initially denied to undergo cardioversion; however, due to persistent A-fib patient later agreed.  Patient converted to normal sinus rhythm but at the time of follow-up he reverted back to A-fib.  Since then he has been on medical therapy.  We have discussed initiating antiarrhythmic medications and/or being evaluated by cardiac electrophysiology for other recommendations.  The patient prefers to be treated conservatively. ? ?He presents for 47-month follow-up visit.  Since last encounter he is doing well from a cardiovascular standpoint.  He is asymptomatic.  Denies chest pain or heart failure symptoms. ? ?FUNCTIONAL STATUS: ?He does yard work regularly but no structured exercise program or daily routine.  ? ?ALLERGIES: ?No Known Allergies ? ?MEDICATION LIST PRIOR TO VISIT: ?Current Meds  ?Medication Sig  ? allopurinol (ZYLOPRIM) 300 MG tablet Take 300 mg by mouth daily.  ? hydrochlorothiazide (HYDRODIURIL) 25 MG tablet Take 25 mg  by mouth daily.  ? ibuprofen (ADVIL) 200 MG tablet Take 400 mg by mouth every 6 (six) hours as needed for moderate pain or headache.  ? metoprolol tartrate (LOPRESSOR) 25 MG tablet Take 12.5 mg by mouth 2 (two) times daily.  ? olmesartan (BENICAR) 40 MG tablet Take 40 mg by mouth daily.  ? Oxcarbazepine (TRILEPTAL) 300 MG tablet Take 300 mg by mouth 2 (two) times daily.  ? rivaroxaban (XARELTO) 20 MG TABS tablet Take 20 mg by mouth daily with supper.  ? zinc gluconate 50 MG tablet Take 50 mg by mouth daily.  ?  ? ?PAST MEDICAL HISTORY: ?Past Medical History:  ?Diagnosis Date  ? Cancer [facility])   ? prostate cancer; pt reports he's "clean" as of his Nov 2019 check-up  ? Hypertension   ? Numbness and tingling   ? in feet bilat comes and goes   ? Ruptured lumbar disc   ? L5 ruptured, L4 deteriorated  ? Trauma   ? great right toe laceration secondary to lawnmower accident   ? Trigeminal neuralgia   ? ? ?PAST SURGICAL HISTORY: ?Past Surgical History:  ?Procedure Laterality Date  ? BUBBLE STUDY  05/24/2020  ? Procedure: BUBBLE STUDY;  Surgeon: Rex Kras, DO;  Location: [place];  Service: Cardiovascular;;  ? CARDIOVERSION N/A 05/24/2020  ? Procedure: CARDIOVERSION;  Surgeon: Rex Kras, DO;  Location: [facility];  Service: Cardiovascular;  Laterality: N/A;  ? KNEE ARTHROSCOPY  2006  ? both knees  ? LYMPHADENECTOMY Bilateral 04/20/2015  ? Procedure: BILATERAL LYMPHADENECTOMY;  Surgeon: Alexis Frock, MD;  Location: [facility];  Service: Urology;  Laterality: Bilateral;  ? ROBOT ASSISTED LAPAROSCOPIC RADICAL PROSTATECTOMY N/A 04/20/2015  ? Procedure:  ROBOTIC ASSISTED LAPAROSCOPIC RADICAL PROSTATECTOMY LEVEL 2;  Surgeon: Alexis Frock, MD;  Location: [facility];  Service: Urology;  Laterality: N/A;  ? TEE WITHOUT CARDIOVERSION N/A 05/24/2020  ? Procedure: TRANSESOPHAGEAL ECHOCARDIOGRAM (TEE);  Surgeon: Rex Kras, DO;  Location: [facility];  Service: Cardiovascular;  Laterality: N/A;  ? TRIGEMINAL NERVE DECOMPRESSION     ? ? ?FAMILY HISTORY: ?The patient family history includes Cancer in his father and mother; Diabetes in his sister. ? ?SOCIAL HISTORY:  ?The patient  reports that he has never smoked. He has never used smokeless tobacco. He reports current alcohol use. He reports that he does not use drugs. ? ?REVIEW OF SYSTEMS: ?Review of Systems  ?Constitutional: Negative for chills and fever.  ?HENT:  Negative for hoarse voice and nosebleeds.   ?Eyes:  Negative for discharge, double vision and pain.  ?Cardiovascular:  Negative for chest pain, claudication, dyspnea on exertion, leg swelling, near-syncope, orthopnea, palpitations, paroxysmal nocturnal dyspnea and syncope.  ?Respiratory:  Negative for hemoptysis and shortness of breath.   ?Musculoskeletal:  Negative for muscle cramps and myalgias.  ?Gastrointestinal:  Negative for abdominal pain, constipation, diarrhea, hematemesis, hematochezia, melena, nausea and vomiting.  ?Neurological:  Negative for light-headedness.  ? ?PHYSICAL EXAM: ? ?  08/02/2021  ? 11:53 AM 02/01/2021  ? 10:09 AM 10/31/2020  ? 10:17 AM  ?Vitals with BMI  ?Height $Remove'6\' 1"'zNvnsFg$  $RemoveB'6\' 1"'lcwOLnrT$  $RemoveBe'6\' 1"'vgyIvqGAA$   ?Weight 265 lbs 257 lbs 255 lbs  ?BMI 34.97 33.91 33.65  ?Systolic 324 401 027  ?Diastolic 77 78 83  ?Pulse 64 55 71  ? ? ?CONSTITUTIONAL: Well-developed and well-nourished. No acute distress.  ?SKIN: Skin is warm and dry. No rash noted. No cyanosis. No pallor. No jaundice ?HEAD: Normocephalic and atraumatic.  ?EYES: No scleral icterus ?MOUTH/THROAT: Moist oral membranes.  ?NECK: No JVD present. No thyromegaly noted. No carotid bruits  ?LYMPHATIC: No visible cervical adenopathy.  ?CHEST Normal respiratory effort. No intercostal retractions  ?LUNGS: Clear to auscultation bilaterally.  No stridor. No wheezes. No rales.  ?CARDIOVASCULAR: Irregularly irregular, positive O5-D6, soft holosystolic murmur heard at the apex, no gallops or rubs. ?ABDOMINAL: Obese, soft, nontender, nondistended, positive bowel sounds all 4 quadrants. No  apparent ascites.  ?EXTREMITIES: No peripheral edema.  Varicose veins bilaterally. ?HEMATOLOGIC: No significant bruising ?NEUROLOGIC: Oriented to person, place, and time. Nonfocal. Normal muscle tone.  ?PSYCHIATRIC: Normal mood and affect. Normal behavior. Cooperative ? ?CARDIAC DATABASE: ?EKG: ?12/24/2019: Atrial fibrillation, 75 bpm, poor R wave progression, low voltage. ?05/10/2020: Atrial fibrillation,65bpm, without underlying injury pattern.  ?10/31/2020: Atrial fibrillation, rate controlled at 60 bpm, without underlying injury pattern.   ? ?Echocardiogram: ?01/07/2020:  ?Study Quality: Technically Difficult  ?Normal LV systolic function with visual EF 50-55%. Left ventricle cavity is normal in size. Moderate left ventricular hypertrophy. Normal global wall motion. Indeterminate LAP. Unable to evaluate diastolic function due to atrial fibrillation. Calculated EF 50%.  ?Left atrial cavity is mildly dilated.  ?Mild (Grade I) mitral regurgitation.  ?Mild tricuspid regurgitation. No evidence of pulmonary hypertension.  ?Mild pulmonic regurgitation.  ?No prior study for comparison. ? ?TEE 05/24/2020: ?LVEF 55-60%, normal right ventricular size and function, no left atrial thrombus.  Left atrial appendage emptying velocity 39 cm/s, small pericardial effusion, mild MR, agitated saline study negative for PFO. ? ?Stress Testing: ?Lexiscan Tetrofosmin Stress Test  12/28/2019:  ?Non-diagnostic ECG stress. Resting EKG demonstrated atrial fibrillation. Non-specific T-wave abnormalities.  ?Peak EKG revealed no significant ST-T change from baseline abnormality.  ?Myocardial perfusion is normal.  ?Overall LV systolic  function is normal without regional wall motion abnormalities. Stress LV EF: 70%.  ?No previous exam available for comparison. Low risk study.  ? ?Heart Catheterization: ?None ? ?Direct-current cardioversion: ?05/24/2020: 200 J of synchronized x1 converted to normal sinus rhythm. ? ?LABORATORY DATA: ?External  Labs: ?Collected: 12/04/2019 at [facility] from care everywhere ?Creatinine 1.3 mg/dL. ?eGFR: 56 mL/min per 1.73 m? ?Hemoglobin 13.8 g/dL ?TSH 1.99 ? ?Collected: 08/22/2020 at [facility]

## 2021-08-07 ENCOUNTER — Ambulatory Visit: Payer: Medicare Other

## 2021-08-07 DIAGNOSIS — I10 Essential (primary) hypertension: Secondary | ICD-10-CM

## 2021-09-04 DIAGNOSIS — M25561 Pain in right knee: Secondary | ICD-10-CM | POA: Diagnosis not present

## 2022-01-29 ENCOUNTER — Ambulatory Visit: Payer: Medicare Other | Admitting: Cardiology

## 2022-01-29 ENCOUNTER — Encounter: Payer: Self-pay | Admitting: Cardiology

## 2022-01-29 VITALS — BP 137/87 | HR 49 | Temp 98.0°F | Resp 16 | Ht 73.0 in | Wt 271.0 lb

## 2022-01-29 DIAGNOSIS — I4819 Other persistent atrial fibrillation: Secondary | ICD-10-CM | POA: Diagnosis not present

## 2022-01-29 DIAGNOSIS — E66812 Obesity, class 2: Secondary | ICD-10-CM

## 2022-01-29 DIAGNOSIS — R072 Precordial pain: Secondary | ICD-10-CM

## 2022-01-29 DIAGNOSIS — E782 Mixed hyperlipidemia: Secondary | ICD-10-CM

## 2022-01-29 DIAGNOSIS — Z7901 Long term (current) use of anticoagulants: Secondary | ICD-10-CM

## 2022-01-29 DIAGNOSIS — I10 Essential (primary) hypertension: Secondary | ICD-10-CM | POA: Diagnosis not present

## 2022-01-29 NOTE — Progress Notes (Signed)
Date:  01/29/2022   ID:  Jerry Hudson, DOB 29-Mar-1951, MRN [id number]  PCP:  Aletha Halim., PA-C  Cardiologist:  Rex Kras, DO, [facility] (established care 12/24/2019)  Date: 01/29/22 Last Office Visit: 08/02/2021  Chief Complaint  Patient presents with   Atrial Fibrillation   Follow-up    6 month    HPI  Jerry Hudson is a 71 y.o. male whose past medical history and cardiovascular risk factors include: Persistent atrial fibrillation, hypertension, history of prostate cancer status post robotic assisted laparoscopic prostatectomy with bilateral pelvic lymph node dissection repair, trigeminal neuralgia status post nerve decompression, history of varicose veins, obesity due to excess calories, advanced age.  Referred to the practice for evaluation and management of atrial fibrillation.  In July 2021, went in for routine colonoscopy and was found to be in A-fib.  Since then has been on rate control strategy and oral anticoagulation for thromboembolic prophylaxis.  Patient did undergo cardioversion which restored normal sinus rhythm but by the time he came back for follow-up he had reverted back to A-fib.  We have discussed antiarrhythmic medications in the past but patient has been hesitant.  He now presents for 74-monthfollow-up visit.  Patient states that he has been having precordial chest pain, substernally located, 5 out of 10 in intensity, lasting for a few minutes, self-limiting, no improving or worsening factors, not always with effort related activities, describes it as an indigestion-like sensation.  At times but not always relieved by carbonated beverages.  FUNCTIONAL STATUS: He does yard work regularly but no structured exercise program or daily routine.   ALLERGIES: No Known Allergies  MEDICATION LIST PRIOR TO VISIT: Current Meds  Medication Sig   allopurinol (ZYLOPRIM) 300 MG tablet Take 300 mg by mouth daily.   hydrochlorothiazide (HYDRODIURIL) 25 MG tablet Take 25  mg by mouth daily.   ibuprofen (ADVIL) 200 MG tablet Take 400 mg by mouth every 6 (six) hours as needed for moderate pain or headache.   metoprolol tartrate (LOPRESSOR) 25 MG tablet Take 12.5 mg by mouth 2 (two) times daily.   olmesartan (BENICAR) 40 MG tablet Take 40 mg by mouth daily.   Oxcarbazepine (TRILEPTAL) 300 MG tablet Take 300 mg by mouth 2 (two) times daily.   rivaroxaban (XARELTO) 20 MG TABS tablet Take 20 mg by mouth daily with supper.   traMADol (ULTRAM) 50 MG tablet Take 50 mg by mouth every 6 (six) hours as needed.   zinc gluconate 50 MG tablet Take 50 mg by mouth daily.     PAST MEDICAL HISTORY: Past Medical History:  Diagnosis Date   Cancer ([facility]    prostate cancer; pt reports he's "clean" as of his Nov 2019 check-up   Hypertension    Numbness and tingling    in feet bilat comes and goes    Ruptured lumbar disc    L5 ruptured, L4 deteriorated   Trauma    great right toe laceration secondary to lawnmower accident    Trigeminal neuralgia     PAST SURGICAL HISTORY: Past Surgical History:  Procedure Laterality Date   BUBBLE STUDY  05/24/2020   Procedure: BUBBLE STUDY;  Surgeon: TRex Kras DO;  Location: [facility]  Service: Cardiovascular;;   CARDIOVERSION N/A 05/24/2020   Procedure: CARDIOVERSION;  Surgeon: TRex Kras DO;  Location: [facility]  Service: Cardiovascular;  Laterality: N/A;   KNEE ARTHROSCOPY  2006   both knees   LYMPHADENECTOMY Bilateral 04/20/2015   Procedure: BILATERAL LYMPHADENECTOMY;  Surgeon: Alexis Frock, MD;  Location: [facility];  Service: Urology;  Laterality: Bilateral;   ROBOT ASSISTED LAPAROSCOPIC RADICAL PROSTATECTOMY N/A 04/20/2015   Procedure: ROBOTIC ASSISTED LAPAROSCOPIC RADICAL PROSTATECTOMY LEVEL 2;  Surgeon: Alexis Frock, MD;  Location: [facility];  Service: Urology;  Laterality: N/A;   TEE WITHOUT CARDIOVERSION N/A 05/24/2020   Procedure: TRANSESOPHAGEAL ECHOCARDIOGRAM (TEE);  Surgeon: Rex Kras, DO;  Location: [facility]  ENDOSCOPY;  Service: Cardiovascular;  Laterality: N/A;   TRIGEMINAL NERVE DECOMPRESSION      FAMILY HISTORY: The patient family history includes Cancer in his father and mother; Diabetes in his sister.  SOCIAL HISTORY:  The patient  reports that he has never smoked. He has never used smokeless tobacco. He reports current alcohol use. He reports that he does not use drugs.  REVIEW OF SYSTEMS: Review of Systems  Cardiovascular:  Positive for chest pain. Negative for claudication, dyspnea on exertion, irregular heartbeat, leg swelling, near-syncope, orthopnea, palpitations, paroxysmal nocturnal dyspnea and syncope.  Respiratory:  Negative for shortness of breath.   Hematologic/Lymphatic: Negative for bleeding problem.  Musculoskeletal:  Positive for arthritis, joint pain and joint swelling. Negative for muscle cramps and myalgias.  Neurological:  Negative for dizziness and light-headedness.    PHYSICAL EXAM:    01/29/2022   10:25 AM 01/29/2022   10:19 AM 08/02/2021   11:53 AM  Vitals with BMI  Height  6' 1" 6' 1"  Weight  271 lbs 265 lbs  BMI  26.71 24.58  Systolic 099 833 825  Diastolic 87 73 77  Pulse 49 54 64   Physical Exam  Constitutional: No distress.  Age appropriate, hemodynamically stable.   Neck: No JVD present.  Cardiovascular: S1 normal, S2 normal, intact distal pulses and normal pulses. An irregularly irregular rhythm present. Bradycardia present. Exam reveals no gallop, no S3 and no S4.  Murmur heard. Holosystolic murmur is present with a grade of 3/6 at the apex radiating to the axilla. Varicose veins bilaterally.  Pulmonary/Chest: Effort normal and breath sounds normal. No stridor. He has no wheezes. He has no rales.  Abdominal: Soft. Bowel sounds are normal. He exhibits no distension. There is no abdominal tenderness.  Musculoskeletal:        General: No edema.     Cervical back: Neck supple.  Neurological: He is alert and oriented to person, place, and time.  He has intact cranial nerves (2-12).  Skin: Skin is warm and moist.   CARDIAC DATABASE: EKG: 01/29/2022: Atrial fibrillation, 49 bpm, without underlying injury pattern.  Echocardiogram: 08/07/2021:  Normal LV systolic function with visual EF 55-60%. Left ventricle cavity is normal in size. Mild left ventricular hypertrophy. Normal global wall motion. Normal LAP. Unable to evaluate diastolic function due to atrial  fibrillation.  Native trileaflet aortic valve.   Trace aortic regurgitation. Aortic valve sclerosis without stenosis.  Mild tricuspid regurgitation. No evidence of pulmonary hypertension.  Mild pulmonic regurgitation.  Compared to study 01/07/2020 no significant change.  TEE 05/24/2020: LVEF 55-60%, normal right ventricular size and function, no left atrial thrombus.  Left atrial appendage emptying velocity 39 cm/s, small pericardial effusion, mild MR, agitated saline study negative for PFO.  Stress Testing: Lexiscan Tetrofosmin Stress Test  12/28/2019:  Non-diagnostic ECG stress. Resting EKG demonstrated atrial fibrillation. Non-specific T-wave abnormalities.  Peak EKG revealed no significant ST-T change from baseline abnormality.  Myocardial perfusion is normal.  Overall LV systolic function is normal without regional wall motion abnormalities. Stress LV EF: 70%.  No previous exam available for  comparison. Low risk study.   Heart Catheterization: None  Direct-current cardioversion: 05/24/2020: 200 J of synchronized x1 converted to normal sinus rhythm.  LABORATORY DATA: External Labs: Collected: 12/04/2019 at [facility] from care everywhere Creatinine 1.3 mg/dL. eGFR: 56 mL/min per 1.73 m Hemoglobin 13.8 g/dL TSH 1.99  Collected: 08/22/2020 at [facility] available in [place]. Hemoglobin 14.7 g/dL. Hematocrit 44.5% Serum creatinine 1.01 mg/dL. AST 13, ALT 11, alkaline phosphatase 92 (within normal limits. GFR:  >60. Hemoglobin A1c 6.3 Total cholesterol 155, triglycerides 124, HDL 39, LDL 96, non-HDL 116  External Labs: Collected: 02/20/2021. Hemoglobin A1c 6. Total cholesterol 173, triglycerides 128, LDL direct 112, HDL 42, non-HDL 131 Hemoglobin 14, hematocrit 42.2% Sodium 141, potassium 4, chloride 104, bicarb 31, BUN 17, creatinine 1.22. AST 15, ALT 11, alkaline phosphatase 105  External Labs: Collected: 08/24/2021. Total cholesterol 175, triglycerides 137, HDL 43, non-HDL 132, direct LDL 130 Sodium 142, potassium 4.1, chloride 104, bicarb 32 BUN 19, creatinine 0.98 AST 14, ALT 12, alkaline phosphatase 98 eGFR 83 A1c 6.2   IMPRESSION:    ICD-10-CM   1. Precordial pain  R07.2 CT CARDIAC SCORING ([facility] LOCATIONS ONLY)    PCV MYOCARDIAL PERFUSION WITH LEXISCAN    CANCELED: PCV MYOCARDIAL PERFUSION WO LEXISCAN    2. Persistent atrial fibrillation (HCC)  I48.19 EKG 12-Lead    3. Long term (current) use of anticoagulants  Z79.01     4. Benign hypertension  I10     5. Mixed hyperlipidemia  E78.2     6. Class 2 severe obesity due to excess calories with serious comorbidity and body mass index (BMI) of 35.0 to 35.9 in adult [facility])  E66.01    Z68.35         RECOMMENDATIONS: Jerry Hudson is a 71 y.o. male whose past medical history and cardiac risk factors include: Persistent atrial fibrillation, hypertension, history of prostate cancer status post robotic assisted laparoscopic prostatectomy with bilateral pelvic lymph node dissection repair, trigeminal neuralgia status post nerve decompression, history of varicose veins, obesity due to excess calories, advanced age.  Precordial pain Predominantly noncardiac discomfort but given the risk factors we will still recommend ischemic work-up. EKG: A-fib without underlying injury pattern. Last stress test was in 2021. We will proceed with pharmacological nuclear stress test.  Patient is unable to exercise due to osteoarthritis of the right  knee.  Coronary calcium score for further risk stratification. Currently not on statin therapy-has an appointment with PCP to have his lipids rechecked.  Patient will defer management to PCP for now. Recommended a trial of PPIs over-the-counter. Educated on seeking medical attention sooner by going to the closest ER via EMS if the symptoms increase in intensity, frequency, duration, or has typical chest pain as discussed in the office.  Patient verbalized understanding.  Persistent atrial fibrillation (HCC) Rate control: Metoprolol. Rhythm control: N/A. Thromboembolic prophylaxis: Xarelto CHA2DS2-VASc SCORE is 2 which correlates to 2.2% risk of stroke per year (age, HTN).  Plan was discussed antiarrhythmic medications even though he has refused them in the past along with EP evaluation.  However given the new onset of precordial pain we will proceed with ischemic work-up as outlined above.  Long term (current) use of anticoagulants Indication: Persistent atrial fibrillation. Does not endorse evidence of bleeding. Has an upcoming appointment with PCP.  Recommended that he have a hemoglobin checked. Reemphasized the risks, benefits, and alternatives to oral anticoagulation.  Benign hypertension Blood pressures are within acceptable  limits. Medications reconciled. No changes warranted at this time.  Mixed hyperlipidemia As of April 2023 direct LDL 130 mg/dL. Based on his estimated 10-year risk of ASCVD would recommend statin therapy. Patient would like to defer lipid management to PCP. I have asked him to have his lipids rechecked as he has an appointment coming up and will also check a coronary calcium score for further risk stratification.  Class 2 severe obesity due to excess calories with serious comorbidity and body mass index (BMI) of 35.0 to 35.9 in adult [facility]) Body mass index is 35.75 kg/m. I reviewed with the patient the importance of diet, regular physical activity/exercise,  weight loss.   Patient is educated on increasing physical activity gradually as tolerated.  With the goal of moderate intensity exercise for 30 minutes a day 5 days a week.   FINAL MEDICATION LIST END OF ENCOUNTER:   No orders of the defined types were placed in this encounter.    Current Outpatient Medications:    allopurinol (ZYLOPRIM) 300 MG tablet, Take 300 mg by mouth daily., Disp: , Rfl: 0   hydrochlorothiazide (HYDRODIURIL) 25 MG tablet, Take 25 mg by mouth daily., Disp: , Rfl:    ibuprofen (ADVIL) 200 MG tablet, Take 400 mg by mouth every 6 (six) hours as needed for moderate pain or headache., Disp: , Rfl:    metoprolol tartrate (LOPRESSOR) 25 MG tablet, Take 12.5 mg by mouth 2 (two) times daily., Disp: , Rfl:    olmesartan (BENICAR) 40 MG tablet, Take 40 mg by mouth daily., Disp: , Rfl:    Oxcarbazepine (TRILEPTAL) 300 MG tablet, Take 300 mg by mouth 2 (two) times daily., Disp: , Rfl:    rivaroxaban (XARELTO) 20 MG TABS tablet, Take 20 mg by mouth daily with supper., Disp: , Rfl:    traMADol (ULTRAM) 50 MG tablet, Take 50 mg by mouth every 6 (six) hours as needed., Disp: , Rfl:    zinc gluconate 50 MG tablet, Take 50 mg by mouth daily., Disp: , Rfl:   Orders Placed This Encounter  Procedures   CT CARDIAC SCORING ([facility] LOCATIONS ONLY)   PCV MYOCARDIAL PERFUSION WITH LEXISCAN   EKG 12-Lead   There are no Patient Instructions on file for this visit.   --Continue cardiac medications as reconciled in final medication list. --Return in about 5 weeks (around 03/05/2022) for Reevaluation of, Chest pain, Review test results. Or sooner if needed. --Continue follow-up with your primary care physician regarding the management of your other chronic comorbid conditions.  Patient's questions and concerns were addressed to his satisfaction. He voices understanding of the instructions provided during this encounter.   This note was created using a voice recognition software as a result there  may be grammatical errors inadvertently enclosed that do not reflect the nature of this encounter. Every attempt is made to correct such errors.  Total time spent: 37 minutes  Mechele Claude [facility]  Pager: [phone] Office: [phone]

## 2022-02-13 ENCOUNTER — Ambulatory Visit: Payer: Medicare Other

## 2022-02-13 DIAGNOSIS — E669 Obesity, unspecified: Secondary | ICD-10-CM | POA: Diagnosis not present

## 2022-02-13 DIAGNOSIS — M1A09X0 Idiopathic chronic gout, multiple sites, without tophus (tophi): Secondary | ICD-10-CM | POA: Diagnosis not present

## 2022-02-13 DIAGNOSIS — Z6837 Body mass index (BMI) 37.0-37.9, adult: Secondary | ICD-10-CM | POA: Diagnosis not present

## 2022-02-13 DIAGNOSIS — R739 Hyperglycemia, unspecified: Secondary | ICD-10-CM | POA: Diagnosis not present

## 2022-02-13 DIAGNOSIS — R072 Precordial pain: Secondary | ICD-10-CM | POA: Diagnosis not present

## 2022-02-13 DIAGNOSIS — M1991 Primary osteoarthritis, unspecified site: Secondary | ICD-10-CM | POA: Diagnosis not present

## 2022-02-13 DIAGNOSIS — I4819 Other persistent atrial fibrillation: Secondary | ICD-10-CM | POA: Diagnosis not present

## 2022-02-13 DIAGNOSIS — I10 Essential (primary) hypertension: Secondary | ICD-10-CM | POA: Diagnosis not present

## 2022-02-13 DIAGNOSIS — Z23 Encounter for immunization: Secondary | ICD-10-CM | POA: Diagnosis not present

## 2022-02-19 NOTE — Progress Notes (Signed)
Called pt to inform him about his lexiscan result. Pt understood

## 2022-02-21 ENCOUNTER — Ambulatory Visit
Admission: RE | Admit: 2022-02-21 | Discharge: 2022-02-21 | Disposition: A | Discharge status: Home / Self Care | Payer: No Typology Code available for payment source | Source: Ambulatory Visit | Attending: Cardiology | Admitting: Cardiology

## 2022-02-21 DIAGNOSIS — R072 Precordial pain: Secondary | ICD-10-CM

## 2022-02-27 NOTE — Progress Notes (Signed)
Called patient to inform him about his CT score. Pt understood

## 2022-03-06 ENCOUNTER — Ambulatory Visit: Payer: Medicare Other | Admitting: Cardiology

## 2022-03-07 ENCOUNTER — Ambulatory Visit: Payer: Medicare Other | Admitting: Cardiology

## 2022-03-08 ENCOUNTER — Ambulatory Visit: Payer: Medicare Other

## 2022-03-08 VITALS — BP 140/83 | HR 56 | Resp 16 | Ht 73.0 in | Wt 267.4 lb

## 2022-03-08 DIAGNOSIS — I4819 Other persistent atrial fibrillation: Secondary | ICD-10-CM | POA: Diagnosis not present

## 2022-03-08 DIAGNOSIS — R072 Precordial pain: Secondary | ICD-10-CM

## 2022-03-08 DIAGNOSIS — R931 Abnormal findings on diagnostic imaging of heart and coronary circulation: Secondary | ICD-10-CM | POA: Diagnosis not present

## 2022-03-08 DIAGNOSIS — Z7901 Long term (current) use of anticoagulants: Secondary | ICD-10-CM | POA: Diagnosis not present

## 2022-03-08 MED ORDER — ISOSORBIDE DINITRATE 30 MG PO TABS: 30.0000 mg | ORAL_TABLET | Freq: Three times a day (TID) | ORAL | 2 refills | Status: DC

## 2022-03-08 MED ORDER — ROSUVASTATIN CALCIUM 40 MG PO TABS: 40.0000 mg | ORAL_TABLET | Freq: Every day | ORAL | 3 refills | Status: DC

## 2022-03-08 NOTE — Progress Notes (Addendum)
Date:  03/08/2022   ID:  [name], DOB 11/15/1950, MRN [id number]  PCP:  Aletha Halim., PA-C  Cardiologist:  Rex Kras, DO, [facility] (established care 12/24/2019)  Date: 03/08/22 Last Office Visit: 01/29/2022  Chief Complaint  Patient presents with   Chest Pain   Atrial Fibrillation   Hypertension   Hyperlipidemia   Follow-up    5 week    HPI  Jerry Hudson is a 71 y.o. male whose past medical history and cardiovascular risk factors include: Persistent atrial fibrillation, hypertension, history of prostate cancer status post robotic assisted laparoscopic prostatectomy with bilateral pelvic lymph node dissection repair, trigeminal neuralgia status post nerve decompression, history of varicose veins, obesity due to excess calories, advanced age.  Initially referred to the practice for evaluation and management of atrial fibrillation.  In July 2021, went in for routine colonoscopy and was found to be in A-fib.  Since then has been on rate control strategy and oral anticoagulation for thromboembolic prophylaxis.  Patient did undergo cardioversion which restored normal sinus rhythm but by the time he came back for follow-up he had reverted back to A-fib.  We have discussed antiarrhythmic medications in the past but patient has been hesitant.  At last office visit, patient was having precordial chest pain, substernally located, 5 out of 10 in intensity, lasting for a few minutes, self-limiting, no improving or worsening factors, not always with effort related activities, describes it as an indigestion-like sensation.    He presents today to 5 week follow-up.  He recently underwent pharmacological stress test that was a low risk study.  He did have CT cardiac calcium score which revealed total calcium score 447 placing him in the 73rd percentile for age, sex, and race. He is still having ongoing episodes of chest pain, unchanged since previous visit. He denies shortness of breath,  palpitations, leg edema, nausea, vomiting.   FUNCTIONAL STATUS: He does yard work regularly but no structured exercise program or daily routine.   ALLERGIES: No Known Allergies  MEDICATION LIST PRIOR TO VISIT: Current Meds  Medication Sig   allopurinol (ZYLOPRIM) 300 MG tablet Take 300 mg by mouth daily.   hydrochlorothiazide (HYDRODIURIL) 25 MG tablet Take 25 mg by mouth daily.   ibuprofen (ADVIL) 200 MG tablet Take 400 mg by mouth every 6 (six) hours as needed for moderate pain or headache.   isosorbide dinitrate (ISORDIL) 30 MG tablet Take 1 tablet (30 mg total) by mouth 3 (three) times daily.   metoprolol tartrate (LOPRESSOR) 25 MG tablet Take 12.5 mg by mouth 2 (two) times daily.   olmesartan (BENICAR) 40 MG tablet Take 40 mg by mouth daily.   Oxcarbazepine (TRILEPTAL) 300 MG tablet Take 300 mg by mouth 2 (two) times daily.   rivaroxaban (XARELTO) 20 MG TABS tablet Take 20 mg by mouth daily with supper.   rosuvastatin (CRESTOR) 40 MG tablet Take 1 tablet (40 mg total) by mouth daily.   traMADol (ULTRAM) 50 MG tablet Take 50 mg by mouth every 6 (six) hours as needed.   zinc gluconate 50 MG tablet Take 50 mg by mouth daily.   [DISCONTINUED] rosuvastatin (CRESTOR) 10 MG tablet Take 1 tablet by mouth daily.     PAST MEDICAL HISTORY: Past Medical History:  Diagnosis Date   Cancer [facility])    prostate cancer; pt reports he's "clean" as of his Nov 2019 check-up   Hyperlipidemia    Hypertension    Numbness and tingling    in  feet bilat comes and goes    Ruptured lumbar disc    L5 ruptured, L4 deteriorated   Trauma    great right toe laceration secondary to lawnmower accident    Trigeminal neuralgia    FAMILY HISTORY: The patient family history includes Cancer in his father and mother; Diabetes in his sister.  SOCIAL HISTORY:  The patient  reports that he has never smoked. He has never used smokeless tobacco. He reports current alcohol use. He reports that he does not use  drugs.  REVIEW OF SYSTEMS: Review of Systems  Cardiovascular:  Positive for chest pain. Negative for claudication, dyspnea on exertion, irregular heartbeat, leg swelling, near-syncope, orthopnea, palpitations, paroxysmal nocturnal dyspnea and syncope.  Respiratory:  Negative for shortness of breath.   Hematologic/Lymphatic: Negative for bleeding problem.  Musculoskeletal:  Positive for arthritis, joint pain and joint swelling. Negative for muscle cramps and myalgias.  Neurological:  Negative for dizziness and light-headedness.    PHYSICAL EXAM:    03/08/2022    2:56 PM 01/29/2022   10:25 AM 01/29/2022   10:19 AM  Vitals with BMI  Height _0   _1   Weight 267 lbs 6 oz  271 lbs  BMI 68.11  57.26  Systolic 203 559 741  Diastolic 83 87 73  Pulse 56 49 54   Physical Exam  Constitutional: No distress.  Age appropriate, hemodynamically stable.   Neck: No JVD present.  Cardiovascular: S1 normal, S2 normal, intact distal pulses and normal pulses. An irregularly irregular rhythm present. Bradycardia present. Exam reveals no gallop, no S3 and no S4.  Murmur heard. Holosystolic murmur is present with a grade of 3/6 at the apex radiating to the axilla. Varicose veins bilaterally.  Pulmonary/Chest: Effort normal and breath sounds normal. No stridor. He has no wheezes. He has no rales.  Abdominal: Soft. Bowel sounds are normal. He exhibits no distension. There is no abdominal tenderness.  Musculoskeletal:        General: No edema.     Cervical back: Neck supple.  Neurological: He is alert and oriented to person, place, and time. He has intact cranial nerves (2-12).  Skin: Skin is warm and moist.   CARDIAC DATABASE: EKG 03/08/2022:  Atrial fibrillation with controlled ventricular response at 53 bpm.  No evidence of ischemia or underlying injury pattern.  Compared to previous EKG on 01/29/2022, no significant change.  Echocardiogram: 08/07/2021:  Normal LV systolic function with visual EF  55-60%. Left ventricle cavity is normal in size. Mild left ventricular hypertrophy. Normal global wall motion. Normal LAP. Unable to evaluate diastolic function due to atrial  fibrillation.  Native trileaflet aortic valve.   Trace aortic regurgitation. Aortic valve sclerosis without stenosis.  Mild tricuspid regurgitation. No evidence of pulmonary hypertension.  Mild pulmonic regurgitation.  Compared to study 01/07/2020 no significant change.  TEE 05/24/2020: LVEF 55-60%, normal right ventricular size and function, no left atrial thrombus.  Left atrial appendage emptying velocity 39 cm/s, small pericardial effusion, mild MR, agitated saline study negative for PFO.  Stress Testing: Lexiscan (with Mod Bruce protocol) Nuclear stress test 02/13/2022: Myocardial perfusion is abnormal. There is a small sized reversible mild defect in the septal and apical regions.  Overall LV systolic function is normal without regional wall motion abnormalities. Stress LV EF: 57%.  Nondiagnostic ECG stress. The heart rate response was consistent with Regadenoson.  No previous exam available for comparison. Low risk.   CT CCS 02/21/2022: FINDINGS: CORONARY CALCIUM SCORES: Left Main: 131 LAD: 186  LCx: 23 RCA: 107   Total Agatston Score: 447 MESA database percentile: 73  Heart Catheterization: None  Direct-current cardioversion: 05/24/2020: 200 J of synchronized x1 converted to normal sinus rhythm.  LABORATORY DATA: External Labs: Collected: 12/04/2019 at [facility] from care everywhere Creatinine 1.3 mg/dL. eGFR: 56 mL/min per 1.73 m Hemoglobin 13.8 g/dL TSH 1.99  Collected: 08/22/2020 at [facility] available in [place]. Hemoglobin 14.7 g/dL. Hematocrit 44.5% Serum creatinine 1.01 mg/dL. AST 13, ALT 11, alkaline phosphatase 92 (within normal limits. GFR: >60. Hemoglobin A1c 6.3 Total cholesterol 155, triglycerides 124, HDL 39, LDL 96,  non-HDL 116  External Labs:  02/13/2022: Sodium 140, potassium 3.9, BUN 20, creatinine 1.09, glucose 103, EGFR 73 Hemoglobin 14.5, hematocrit 44.5, MCV 97.4, platelets 208 Cholesterol 205, triglycerides 199, HDL 35, LDL 138  IMPRESSION:    ICD-10-CM   1. Precordial pain  R07.2 EKG 12-Lead    2. Persistent atrial fibrillation (HCC)  I48.19     3. Long term (current) use of anticoagulants  Z79.01     4. Elevated coronary artery calcium score  R93.1       RECOMMENDATIONS: WHYATT KLINGER is a 71 y.o. male whose past medical history and cardiac risk factors include: Persistent atrial fibrillation, hypertension, history of prostate cancer status post robotic assisted laparoscopic prostatectomy with bilateral pelvic lymph node dissection repair, trigeminal neuralgia status post nerve decompression, history of varicose veins, obesity due to excess calories, advanced age.  Precordial pain EKG: A-fib without underlying injury pattern. Given ongoing complaints of chest pain and elevated coronary calcium score as well uncontrolled lipids will schedule for left heart catheterization.  Plan to proceed with this next Tuesday 03/13/2022 with Dr. Virgina Jock. Will increase rosuvastatin to 40 mg daily and start isosorbide 30 mg 3 times daily.  No changes to other medications.  Patient instructed not to do heavy lifting, heavy exertional activity, swimming until evaluation is complete.  Patient instructed to call if symptoms worse or to go to the ED for further evaluation.  Schedule for cardiac catheterization, and possible angioplasty. We discussed regarding risks, benefits, alternatives to this including stress testing, CTA and continued medical therapy. Patient wants to proceed. Understands <1-2% risk of death, stroke, MI, urgent CABG, bleeding, infection, renal failure but not limited to these.  Educated on seeking medical attention sooner by going to the closest ER via EMS if the symptoms increase  in intensity, frequency, duration, or has typical chest pain as discussed in the office.  Patient verbalized understanding.  Persistent atrial fibrillation (HCC) Rate control: Metoprolol. Rhythm control: N/A. Thromboembolic prophylaxis: Xarelto CHA2DS2-VASc SCORE is 2 which correlates to 2.2% risk of stroke per year (age, HTN).  He continues to refuse antiarrhythmic medications.  Long term (current) use of anticoagulants Indication: Persistent atrial fibrillation. Does not endorse evidence of bleeding. Reemphasized the risks, benefits, and alternatives to oral anticoagulation.  Benign hypertension Blood pressures are within acceptable limits. Medications reconciled. No changes warranted at this time.  Mixed hyperlipidemia Reviewed recent labs.  LDL remains elevated.  In view of this and elevated calcium score will increase rosuvastatin to 40 mg daily. Discussed importance of lifestyle modifications including diet and weight loss.  Assessment and plan discussed with Dr. Virgina Jock.  Follow-up after cardiac catheterization.  FINAL MEDICATION LIST END OF ENCOUNTER:   Meds ordered this encounter  Medications   rosuvastatin (CRESTOR) 40 MG tablet    Sig: Take 1 tablet (40 mg total) by mouth daily.  Dispense:  90 tablet    Refill:  3    Order Specific Question:   Supervising Provider    Answer:   Adrian Prows [[id number]]   isosorbide dinitrate (ISORDIL) 30 MG tablet    Sig: Take 1 tablet (30 mg total) by mouth 3 (three) times daily.    Dispense:  90 tablet    Refill:  2    Order Specific Question:   Supervising Provider    Answer:   Frederica Kuster     Current Outpatient Medications:    allopurinol (ZYLOPRIM) 300 MG tablet, Take 300 mg by mouth daily., Disp: , Rfl: 0   hydrochlorothiazide (HYDRODIURIL) 25 MG tablet, Take 25 mg by mouth daily., Disp: , Rfl:    ibuprofen (ADVIL) 200 MG tablet, Take 400 mg by mouth every 6 (six) hours as needed for moderate pain or headache., Disp:  , Rfl:    isosorbide dinitrate (ISORDIL) 30 MG tablet, Take 1 tablet (30 mg total) by mouth 3 (three) times daily., Disp: 90 tablet, Rfl: 2   metoprolol tartrate (LOPRESSOR) 25 MG tablet, Take 12.5 mg by mouth 2 (two) times daily., Disp: , Rfl:    olmesartan (BENICAR) 40 MG tablet, Take 40 mg by mouth daily., Disp: , Rfl:    Oxcarbazepine (TRILEPTAL) 300 MG tablet, Take 300 mg by mouth 2 (two) times daily., Disp: , Rfl:    rivaroxaban (XARELTO) 20 MG TABS tablet, Take 20 mg by mouth daily with supper., Disp: , Rfl:    rosuvastatin (CRESTOR) 40 MG tablet, Take 1 tablet (40 mg total) by mouth daily., Disp: 90 tablet, Rfl: 3   traMADol (ULTRAM) 50 MG tablet, Take 50 mg by mouth every 6 (six) hours as needed., Disp: , Rfl:    zinc gluconate 50 MG tablet, Take 50 mg by mouth daily., Disp: , Rfl:    B Complex Vitamins (VITAMIN B COMPLEX) TABS, Take 1 tablet by mouth daily., Disp: , Rfl:   Orders Placed This Encounter  Procedures   EKG 12-Lead   There are no Patient Instructions on file for this visit.   --Continue cardiac medications as reconciled in final medication list. --No follow-ups on file. Or sooner if needed. --Continue follow-up with your primary care physician regarding the management of your other chronic comorbid conditions.  Patient's questions and concerns were addressed to his satisfaction. He voices understanding of the instructions provided during this encounter.   This note was created using a voice recognition software as a result there may be grammatical errors inadvertently enclosed that do not reflect the nature of this encounter. Every attempt is made to correct such errors.    Ernst Spell, [place] Pager: [phone] Office: [phone]

## 2022-03-13 ENCOUNTER — Ambulatory Visit (HOSPITAL_COMMUNITY)
Admission: RE | Admit: 2022-03-13 | Discharge: 2022-03-13 | Disposition: A | Discharge status: Home / Self Care | Payer: Medicare Other | Source: Ambulatory Visit | Attending: Cardiology | Admitting: Cardiology

## 2022-03-13 ENCOUNTER — Encounter (HOSPITAL_COMMUNITY)
Admission: RE | Disposition: A | Discharge status: Home / Self Care | Payer: Self-pay | Source: Ambulatory Visit | Attending: Cardiology

## 2022-03-13 ENCOUNTER — Other Ambulatory Visit: Payer: Self-pay

## 2022-03-13 DIAGNOSIS — I4819 Other persistent atrial fibrillation: Secondary | ICD-10-CM | POA: Diagnosis not present

## 2022-03-13 DIAGNOSIS — Z7901 Long term (current) use of anticoagulants: Secondary | ICD-10-CM | POA: Diagnosis not present

## 2022-03-13 DIAGNOSIS — Z79899 Other long term (current) drug therapy: Secondary | ICD-10-CM | POA: Diagnosis not present

## 2022-03-13 DIAGNOSIS — I10 Essential (primary) hypertension: Secondary | ICD-10-CM | POA: Diagnosis not present

## 2022-03-13 DIAGNOSIS — Z8546 Personal history of malignant neoplasm of prostate: Secondary | ICD-10-CM | POA: Diagnosis not present

## 2022-03-13 DIAGNOSIS — E782 Mixed hyperlipidemia: Secondary | ICD-10-CM | POA: Insufficient documentation

## 2022-03-13 DIAGNOSIS — I25118 Atherosclerotic heart disease of native coronary artery with other forms of angina pectoris: Secondary | ICD-10-CM

## 2022-03-13 DIAGNOSIS — Z9079 Acquired absence of other genital organ(s): Secondary | ICD-10-CM | POA: Diagnosis not present

## 2022-03-13 HISTORY — PX: LEFT HEART CATH AND CORONARY ANGIOGRAPHY: CATH118249

## 2022-03-13 HISTORY — PX: INTRAVASCULAR PRESSURE WIRE/FFR STUDY: CATH118243

## 2022-03-13 SURGERY — LEFT HEART CATH AND CORONARY ANGIOGRAPHY
Anesthesia: LOCAL

## 2022-03-13 MED ORDER — SODIUM CHLORIDE 0.9 % WEIGHT BASED INFUSION
3.0000 mL/kg/h | INTRAVENOUS | Status: AC
  Administered 2022-03-13: 3 mL/kg/h via INTRAVENOUS

## 2022-03-13 MED ORDER — ACETAMINOPHEN 325 MG PO TABS
ORAL_TABLET | ORAL | Status: AC
  Filled 2022-03-13: qty 2

## 2022-03-13 MED ORDER — SODIUM CHLORIDE 0.9% FLUSH: 3.0000 mL | Freq: Two times a day (BID) | INTRAVENOUS | Status: DC

## 2022-03-13 MED ORDER — LIDOCAINE HCL (PF) 1 % IJ SOLN
INTRAMUSCULAR | Status: AC
  Filled 2022-03-13: qty 30

## 2022-03-13 MED ORDER — ASPIRIN 81 MG PO CHEW
81.0000 mg | CHEWABLE_TABLET | ORAL | Status: AC
  Administered 2022-03-13: 81 mg via ORAL
  Filled 2022-03-13: qty 1

## 2022-03-13 MED ORDER — HYDRALAZINE HCL 20 MG/ML IJ SOLN: 10.0000 mg | INTRAMUSCULAR | Status: DC | PRN

## 2022-03-13 MED ORDER — SODIUM CHLORIDE 0.9 % IV SOLN: 250.0000 mL | INTRAVENOUS | Status: DC | PRN

## 2022-03-13 MED ORDER — VERAPAMIL HCL 2.5 MG/ML IV SOLN
INTRAVENOUS | Status: AC
  Filled 2022-03-13: qty 2

## 2022-03-13 MED ORDER — SODIUM CHLORIDE 0.9% FLUSH: 3.0000 mL | INTRAVENOUS | Status: DC | PRN

## 2022-03-13 MED ORDER — HEPARIN (PORCINE) IN NACL 1000-0.9 UT/500ML-% IV SOLN
INTRAVENOUS | Status: DC | PRN
  Administered 2022-03-13 (×2): 500 mL

## 2022-03-13 MED ORDER — MIDAZOLAM HCL 2 MG/2ML IJ SOLN
INTRAMUSCULAR | Status: AC
  Filled 2022-03-13: qty 2

## 2022-03-13 MED ORDER — SODIUM CHLORIDE 0.9 % IV SOLN: INTRAVENOUS | Status: AC

## 2022-03-13 MED ORDER — NITROGLYCERIN 1 MG/10 ML FOR IR/CATH LAB
INTRA_ARTERIAL | Status: DC | PRN
  Administered 2022-03-13: 200 ug via INTRACORONARY

## 2022-03-13 MED ORDER — HEPARIN SODIUM (PORCINE) 1000 UNIT/ML IJ SOLN
INTRAMUSCULAR | Status: AC
  Filled 2022-03-13: qty 10

## 2022-03-13 MED ORDER — SODIUM CHLORIDE 0.9 % WEIGHT BASED INFUSION: 1.0000 mL/kg/h | INTRAVENOUS | Status: DC

## 2022-03-13 MED ORDER — HEPARIN SODIUM (PORCINE) 1000 UNIT/ML IJ SOLN
INTRAMUSCULAR | Status: DC | PRN
  Administered 2022-03-13: 7000 [IU] via INTRAVENOUS
  Administered 2022-03-13: 5000 [IU] via INTRAVENOUS

## 2022-03-13 MED ORDER — IOHEXOL 350 MG/ML SOLN
INTRAVENOUS | Status: DC | PRN
  Administered 2022-03-13: 65 mL

## 2022-03-13 MED ORDER — FENTANYL CITRATE (PF) 100 MCG/2ML IJ SOLN
INTRAMUSCULAR | Status: AC
  Filled 2022-03-13: qty 2

## 2022-03-13 MED ORDER — NITROGLYCERIN 1 MG/10 ML FOR IR/CATH LAB
INTRA_ARTERIAL | Status: AC
  Filled 2022-03-13: qty 10

## 2022-03-13 MED ORDER — ACETAMINOPHEN 325 MG PO TABS: 650.0000 mg | ORAL_TABLET | ORAL | Status: DC | PRN

## 2022-03-13 MED ORDER — LIDOCAINE HCL (PF) 1 % IJ SOLN
INTRAMUSCULAR | Status: DC | PRN
  Administered 2022-03-13: 2 mL

## 2022-03-13 MED ORDER — ACETAMINOPHEN 325 MG PO TABS
650.0000 mg | ORAL_TABLET | Freq: Once | ORAL | Status: AC
  Administered 2022-03-13: 650 mg via ORAL

## 2022-03-13 MED ORDER — LABETALOL HCL 5 MG/ML IV SOLN: 10.0000 mg | INTRAVENOUS | Status: DC | PRN

## 2022-03-13 MED ORDER — MIDAZOLAM HCL 2 MG/2ML IJ SOLN
INTRAMUSCULAR | Status: DC | PRN
  Administered 2022-03-13: 1 mg via INTRAVENOUS

## 2022-03-13 MED ORDER — ONDANSETRON HCL 4 MG/2ML IJ SOLN: 4.0000 mg | Freq: Four times a day (QID) | INTRAMUSCULAR | Status: DC | PRN

## 2022-03-13 MED ORDER — HEPARIN (PORCINE) IN NACL 1000-0.9 UT/500ML-% IV SOLN
INTRAVENOUS | Status: AC
  Filled 2022-03-13: qty 1000

## 2022-03-13 MED ORDER — VERAPAMIL HCL 2.5 MG/ML IV SOLN
INTRAVENOUS | Status: DC | PRN
  Administered 2022-03-13: 10 mL via INTRA_ARTERIAL

## 2022-03-13 MED ORDER — FENTANYL CITRATE (PF) 100 MCG/2ML IJ SOLN
INTRAMUSCULAR | Status: DC | PRN
  Administered 2022-03-13: 50 ug via INTRAVENOUS

## 2022-03-13 SURGICAL SUPPLY — 13 items
BAND ZEPHYR COMPRESS 30 LONG (HEMOSTASIS) IMPLANT
CATH INFINITI 5 FR JL3.5 (CATHETERS) IMPLANT
CATH LAUNCHER 6FR EBU3.5 (CATHETERS) IMPLANT
CATH OPTITORQUE TIG 4.0 5F (CATHETERS) IMPLANT
GLIDESHEATH SLEND A-KIT 6F 22G (SHEATH) IMPLANT
GUIDEWIRE INQWIRE 1.5J.035X260 (WIRE) IMPLANT
GUIDEWIRE PRESSURE X 175 (WIRE) IMPLANT
INQWIRE 1.5J .035X260CM (WIRE) ×1
KIT HEART LEFT (KITS) ×1 IMPLANT
KIT HEMO VALVE WATCHDOG (MISCELLANEOUS) IMPLANT
PACK CARDIAC CATHETERIZATION (CUSTOM PROCEDURE TRAY) ×1 IMPLANT
TRANSDUCER W/STOPCOCK (MISCELLANEOUS) ×1 IMPLANT
TUBING CIL FLEX 10 FLL-RA (TUBING) ×1 IMPLANT

## 2022-03-13 NOTE — Interval H&P Note (Signed)
History and Physical Interval Note:  03/13/2022 11:13 AM  Jerry Hudson  has presented today for surgery, with the diagnosis of precordial pain.  The various methods of treatment have been discussed with the patient and family. After consideration of risks, benefits and other options for treatment, the patient has consented to  Procedure(s): LEFT HEART CATH AND CORONARY ANGIOGRAPHY (N/A) as a surgical intervention.  The patient's history has been reviewed, patient examined, no change in status, stable for surgery.  I have reviewed the patient's chart and labs.  Questions were answered to the patient's satisfaction.    2016/2017 Appropriate Use Criteria for Coronary Revascularization Symptom Status: Ischemic Symptoms  Non-invasive Testing: Low risk  If no or indeterminate stress test, FFR/iFR results in all diseased vessels: N/A  Diabetes Mellitus: No  S/P CABG: No  Antianginal therapy (number of long-acting drugs): >=2  Patient undergoing renal transplant: No  Patient undergoing percutaneous valve procedure: No  1 Vessel Disease PCI CABG  No proximal LAD involvement, No proximal left dominant LCX involvement A (7); Indication 1 M (5); Indication 1  Proximal left dominant LCX involvement A (7); Indication 4 A (7); Indication 4  Proximal LAD involvement A (7); Indication 4 A (7); Indication 4  2 Vessel Disease  No proximal LAD involvement A (7); Indication 7 M (6); Indication 7  Proximal LAD involvement A (7); Indication 10 A (7); Indication 10  3 Vessel Disease  Low disease complexity (e.g., focal stenoses, SYNTAX <=22) A (7); Indication 16 A (7); Indication 16  Intermediate or high disease complexity (e.g., SYNTAX >=23) M (6); Indication 20 A (8); Indication 20  Left Main Disease  Isolated LMCA disease: ostial or midshaft A (7); Indication 24 A (9); Indication 24  Isolated LMCA disease: bifurcation involvement M (6); Indication 25 A (9); Indication 25  LMCA ostial or midshaft,  concurrent low disease burden multivessel disease (e.g., 1-2 additional focal stenoses, SYNTAX <=22) A (7); Indication 26 A (9); Indication 26  LMCA ostial or midshaft, concurrent intermediate or high disease burden multivessel disease (e.g., 1-2 additional bifurcation stenoses, long stenoses, SYNTAX >=23) M (4); Indication 27 A (9); Indication 27  LMCA bifurcation involvement, concurrent low disease burden multivessel disease (e.g., 1-2 additional focal stenoses, SYNTAX <=22) M (6); Indication 28 A (9); Indication 28  LMCA bifurcation involvement, concurrent intermediate or high disease burden multivessel disease (e.g., 1-2 additional bifurcation stenoses, long stenoses, SYNTAX >=23) R (3); Indication 29 A (9); Indication [place]

## 2022-03-13 NOTE — H&P (Signed)
OV 03/08/2022 copied for documentation    Date:  03/08/2022   ID:  Jerry Hudson, DOB 20-Jun-1950, MRN [id number]  PCP:  Aletha Halim., PA-C  Cardiologist:  Rex Kras, DO, [facility] (established care 12/24/2019)  Date: 03/08/22 Last Office Visit: 01/29/2022  Chief Complaint  Patient presents with   Chest Pain   Atrial Fibrillation   Hypertension   Hyperlipidemia   Follow-up    5 week    HPI  Jerry Hudson is a 71 y.o. male whose past medical history and cardiovascular risk factors include: Persistent atrial fibrillation, hypertension, history of prostate cancer status post robotic assisted laparoscopic prostatectomy with bilateral pelvic lymph node dissection repair, trigeminal neuralgia status post nerve decompression, history of varicose veins, obesity due to excess calories, advanced age.  Initially referred to the practice for evaluation and management of atrial fibrillation.  In July 2021, went in for routine colonoscopy and was found to be in A-fib.  Since then has been on rate control strategy and oral anticoagulation for thromboembolic prophylaxis.  Patient did undergo cardioversion which restored normal sinus rhythm but by the time he came back for follow-up he had reverted back to A-fib.  We have discussed antiarrhythmic medications in the past but patient has been hesitant.  At last office visit, patient was having precordial chest pain, substernally located, 5 out of 10 in intensity, lasting for a few minutes, self-limiting, no improving or worsening factors, not always with effort related activities, describes it as an indigestion-like sensation.    He presents today to 5 week follow-up.  He recently underwent pharmacological stress test that was a low risk study.  He did have CT cardiac calcium score which revealed total calcium score 447 placing him in the 73rd percentile for age, sex, and race. He is still having ongoing episodes of chest pain, unchanged since previous  visit. He denies shortness of breath, palpitations, leg edema, nausea, vomiting.   FUNCTIONAL STATUS: He does yard work regularly but no structured exercise program or daily routine.   ALLERGIES: No Known Allergies  MEDICATION LIST PRIOR TO VISIT: Current Meds  Medication Sig   allopurinol (ZYLOPRIM) 300 MG tablet Take 300 mg by mouth daily.   hydrochlorothiazide (HYDRODIURIL) 25 MG tablet Take 25 mg by mouth daily.   ibuprofen (ADVIL) 200 MG tablet Take 400 mg by mouth every 6 (six) hours as needed for moderate pain or headache.   isosorbide dinitrate (ISORDIL) 30 MG tablet Take 1 tablet (30 mg total) by mouth 3 (three) times daily.   metoprolol tartrate (LOPRESSOR) 25 MG tablet Take 12.5 mg by mouth 2 (two) times daily.   olmesartan (BENICAR) 40 MG tablet Take 40 mg by mouth daily.   Oxcarbazepine (TRILEPTAL) 300 MG tablet Take 300 mg by mouth 2 (two) times daily.   rivaroxaban (XARELTO) 20 MG TABS tablet Take 20 mg by mouth daily with supper.   rosuvastatin (CRESTOR) 40 MG tablet Take 1 tablet (40 mg total) by mouth daily.   traMADol (ULTRAM) 50 MG tablet Take 50 mg by mouth every 6 (six) hours as needed.   zinc gluconate 50 MG tablet Take 50 mg by mouth daily.   [DISCONTINUED] rosuvastatin (CRESTOR) 10 MG tablet Take 1 tablet by mouth daily.     PAST MEDICAL HISTORY: Past Medical History:  Diagnosis Date   Cancer [facility])    prostate cancer; pt reports he's "clean" as of his Nov 2019 check-up   Hyperlipidemia    Hypertension  Numbness and tingling    in feet bilat comes and goes    Ruptured lumbar disc    L5 ruptured, L4 deteriorated   Trauma    great right toe laceration secondary to lawnmower accident    Trigeminal neuralgia    FAMILY HISTORY: The patient family history includes Cancer in his father and mother; Diabetes in his sister.  SOCIAL HISTORY:  The patient  reports that he has never smoked. He has never used smokeless tobacco. He reports current alcohol use.  He reports that he does not use drugs.  REVIEW OF SYSTEMS: Review of Systems  Cardiovascular:  Positive for chest pain. Negative for claudication, dyspnea on exertion, irregular heartbeat, leg swelling, near-syncope, orthopnea, palpitations, paroxysmal nocturnal dyspnea and syncope.  Respiratory:  Negative for shortness of breath.   Hematologic/Lymphatic: Negative for bleeding problem.  Musculoskeletal:  Positive for arthritis, joint pain and joint swelling. Negative for muscle cramps and myalgias.  Neurological:  Negative for dizziness and light-headedness.    PHYSICAL EXAM:    03/08/2022    2:56 PM 01/29/2022   10:25 AM 01/29/2022   10:19 AM  Vitals with BMI  Height _0   _1   Weight 267 lbs 6 oz  271 lbs  BMI 62.37  62.83  Systolic 151 761 607  Diastolic 83 87 73  Pulse 56 49 54   Physical Exam  Constitutional: No distress.  Age appropriate, hemodynamically stable.   Neck: No JVD present.  Cardiovascular: S1 normal, S2 normal, intact distal pulses and normal pulses. An irregularly irregular rhythm present. Bradycardia present. Exam reveals no gallop, no S3 and no S4.  Murmur heard. Holosystolic murmur is present with a grade of 3/6 at the apex radiating to the axilla. Varicose veins bilaterally.  Pulmonary/Chest: Effort normal and breath sounds normal. No stridor. He has no wheezes. He has no rales.  Abdominal: Soft. Bowel sounds are normal. He exhibits no distension. There is no abdominal tenderness.  Musculoskeletal:        General: No edema.     Cervical back: Neck supple.  Neurological: He is alert and oriented to person, place, and time. He has intact cranial nerves (2-12).  Skin: Skin is warm and moist.   CARDIAC DATABASE: EKG 03/08/2022:  Atrial fibrillation with controlled ventricular response at 53 bpm.  No evidence of ischemia or underlying injury pattern.  Compared to previous EKG on 01/29/2022, no significant change.  Echocardiogram: 08/07/2021:  Normal LV  systolic function with visual EF 55-60%. Left ventricle cavity is normal in size. Mild left ventricular hypertrophy. Normal global wall motion. Normal LAP. Unable to evaluate diastolic function due to atrial  fibrillation.  Native trileaflet aortic valve.   Trace aortic regurgitation. Aortic valve sclerosis without stenosis.  Mild tricuspid regurgitation. No evidence of pulmonary hypertension.  Mild pulmonic regurgitation.  Compared to study 01/07/2020 no significant change.  TEE 05/24/2020: LVEF 55-60%, normal right ventricular size and function, no left atrial thrombus.  Left atrial appendage emptying velocity 39 cm/s, small pericardial effusion, mild MR, agitated saline study negative for PFO.  Stress Testing: Lexiscan (with Mod Bruce protocol) Nuclear stress test 02/13/2022: Myocardial perfusion is abnormal. There is a small sized reversible mild defect in the septal and apical regions.  Overall LV systolic function is normal without regional wall motion abnormalities. Stress LV EF: 57%.  Nondiagnostic ECG stress. The heart rate response was consistent with Regadenoson.  No previous exam available for comparison. Low risk.   CT CCS 02/21/2022: FINDINGS: CORONARY  CALCIUM SCORES: Left Main: 131 LAD: 186 LCx: 23 RCA: 107   Total Agatston Score: 447 MESA database percentile: 73  Heart Catheterization: None  Direct-current cardioversion: 05/24/2020: 200 J of synchronized x1 converted to normal sinus rhythm.  LABORATORY DATA: External Labs: Collected: 12/04/2019 at [facility] from care everywhere Creatinine 1.3 mg/dL. eGFR: 56 mL/min per 1.73 m Hemoglobin 13.8 g/dL TSH 1.99  Collected: 08/22/2020 at [facility] available in [place]. Hemoglobin 14.7 g/dL. Hematocrit 44.5% Serum creatinine 1.01 mg/dL. AST 13, ALT 11, alkaline phosphatase 92 (within normal limits. GFR: >60. Hemoglobin A1c 6.3 Total cholesterol 155,  triglycerides 124, HDL 39, LDL 96, non-HDL 116  External Labs:  02/13/2022: Sodium 140, potassium 3.9, BUN 20, creatinine 1.09, glucose 103, EGFR 73 Hemoglobin 14.5, hematocrit 44.5, MCV 97.4, platelets 208 Cholesterol 205, triglycerides 199, HDL 35, LDL 138  IMPRESSION:    ICD-10-CM   1. Precordial pain  R07.2 EKG 12-Lead    2. Persistent atrial fibrillation (HCC)  I48.19     3. Long term (current) use of anticoagulants  Z79.01     4. Elevated coronary artery calcium score  R93.1       RECOMMENDATIONS: DESMUND ELMAN is a 71 y.o. male whose past medical history and cardiac risk factors include: Persistent atrial fibrillation, hypertension, history of prostate cancer status post robotic assisted laparoscopic prostatectomy with bilateral pelvic lymph node dissection repair, trigeminal neuralgia status post nerve decompression, history of varicose veins, obesity due to excess calories, advanced age.  Precordial pain EKG: A-fib without underlying injury pattern. Given ongoing complaints of chest pain and elevated coronary calcium score as well uncontrolled lipids will schedule for left heart catheterization.  Plan to proceed with this next Tuesday 03/13/2022 with Dr. Virgina Jock. Will increase rosuvastatin to 40 mg daily and start isosorbide 30 mg 3 times daily.  No changes to other medications.  Patient instructed not to do heavy lifting, heavy exertional activity, swimming until evaluation is complete.  Patient instructed to call if symptoms worse or to go to the ED for further evaluation.  Schedule for cardiac catheterization, and possible angioplasty. We discussed regarding risks, benefits, alternatives to this including stress testing, CTA and continued medical therapy. Patient wants to proceed. Understands <1-2% risk of death, stroke, MI, urgent CABG, bleeding, infection, renal failure but not limited to these.  Educated on seeking medical attention sooner by going to the closest  ER via EMS if the symptoms increase in intensity, frequency, duration, or has typical chest pain as discussed in the office.  Patient verbalized understanding.  Persistent atrial fibrillation (HCC) Rate control: Metoprolol. Rhythm control: N/A. Thromboembolic prophylaxis: Xarelto CHA2DS2-VASc SCORE is 2 which correlates to 2.2% risk of stroke per year (age, HTN).  He continues to refuse antiarrhythmic medications.  Long term (current) use of anticoagulants Indication: Persistent atrial fibrillation. Does not endorse evidence of bleeding. Reemphasized the risks, benefits, and alternatives to oral anticoagulation.  Benign hypertension Blood pressures are within acceptable limits. Medications reconciled. No changes warranted at this time.  Mixed hyperlipidemia Reviewed recent labs.  LDL remains elevated.  In view of this and elevated calcium score will increase rosuvastatin to 40 mg daily. Discussed importance of lifestyle modifications including diet and weight loss.  Assessment and plan discussed with Dr. Virgina Jock.  Follow-up after cardiac catheterization.  FINAL MEDICATION LIST END OF ENCOUNTER:   Meds ordered this encounter  Medications   rosuvastatin (CRESTOR) 40 MG tablet    Sig: Take 1 tablet (  40 mg total) by mouth daily.    Dispense:  90 tablet    Refill:  3    Order Specific Question:   Supervising Provider    Answer:   Adrian Prows [[id number]]   isosorbide dinitrate (ISORDIL) 30 MG tablet    Sig: Take 1 tablet (30 mg total) by mouth 3 (three) times daily.    Dispense:  90 tablet    Refill:  2    Order Specific Question:   Supervising Provider    Answer:   Frederica Kuster     Current Outpatient Medications:    allopurinol (ZYLOPRIM) 300 MG tablet, Take 300 mg by mouth daily., Disp: , Rfl: 0   hydrochlorothiazide (HYDRODIURIL) 25 MG tablet, Take 25 mg by mouth daily., Disp: , Rfl:    ibuprofen (ADVIL) 200 MG tablet, Take 400 mg by mouth every 6 (six) hours as needed  for moderate pain or headache., Disp: , Rfl:    isosorbide dinitrate (ISORDIL) 30 MG tablet, Take 1 tablet (30 mg total) by mouth 3 (three) times daily., Disp: 90 tablet, Rfl: 2   metoprolol tartrate (LOPRESSOR) 25 MG tablet, Take 12.5 mg by mouth 2 (two) times daily., Disp: , Rfl:    olmesartan (BENICAR) 40 MG tablet, Take 40 mg by mouth daily., Disp: , Rfl:    Oxcarbazepine (TRILEPTAL) 300 MG tablet, Take 300 mg by mouth 2 (two) times daily., Disp: , Rfl:    rivaroxaban (XARELTO) 20 MG TABS tablet, Take 20 mg by mouth daily with supper., Disp: , Rfl:    rosuvastatin (CRESTOR) 40 MG tablet, Take 1 tablet (40 mg total) by mouth daily., Disp: 90 tablet, Rfl: 3   traMADol (ULTRAM) 50 MG tablet, Take 50 mg by mouth every 6 (six) hours as needed., Disp: , Rfl:    zinc gluconate 50 MG tablet, Take 50 mg by mouth daily., Disp: , Rfl:    B Complex Vitamins (VITAMIN B COMPLEX) TABS, Take 1 tablet by mouth daily., Disp: , Rfl:   Orders Placed This Encounter  Procedures   EKG 12-Lead   There are no Patient Instructions on file for this visit.   --Continue cardiac medications as reconciled in final medication list. --No follow-ups on file. Or sooner if needed. --Continue follow-up with your primary care physician regarding the management of your other chronic comorbid conditions.  Patient's questions and concerns were addressed to his satisfaction. He voices understanding of the instructions provided during this encounter.   This note was created using a voice recognition software as a result there may be grammatical errors inadvertently enclosed that do not reflect the nature of this encounter. Every attempt is made to correct such errors.    Ernst Spell, [place] Pager: [phone] Office: [phone]

## 2022-03-13 NOTE — Progress Notes (Signed)
Per Dr Virgina Jock ok to d/c home now

## 2022-03-13 NOTE — Progress Notes (Signed)
Per Dr Virgina Jock no telemetry needed

## 2022-03-14 ENCOUNTER — Encounter (HOSPITAL_COMMUNITY): Payer: Self-pay | Admitting: Cardiology

## 2022-03-27 ENCOUNTER — Ambulatory Visit: Payer: Medicare Other

## 2022-04-04 ENCOUNTER — Ambulatory Visit: Payer: Medicare Other

## 2022-04-04 VITALS — BP 146/76 | HR 64 | Resp 16 | Ht 72.0 in | Wt 269.0 lb

## 2022-04-04 DIAGNOSIS — R931 Abnormal findings on diagnostic imaging of heart and coronary circulation: Secondary | ICD-10-CM | POA: Diagnosis not present

## 2022-04-04 DIAGNOSIS — I4819 Other persistent atrial fibrillation: Secondary | ICD-10-CM

## 2022-04-04 DIAGNOSIS — Z7901 Long term (current) use of anticoagulants: Secondary | ICD-10-CM

## 2022-04-04 DIAGNOSIS — I10 Essential (primary) hypertension: Secondary | ICD-10-CM

## 2022-04-04 DIAGNOSIS — Z9889 Other specified postprocedural states: Secondary | ICD-10-CM

## 2022-04-04 DIAGNOSIS — E782 Mixed hyperlipidemia: Secondary | ICD-10-CM

## 2022-04-04 NOTE — Progress Notes (Addendum)
Date:  04/04/2022   ID:  Jerry Hudson, DOB 1950-12-16, MRN [id number]  PCP:  Aletha Halim., PA-C  Cardiologist:  Rex Kras, DO, [facility] (established care 12/24/2019)  Date: 04/04/22 Last Office Visit: 01/29/2022  Chief Complaint  Patient presents with   Chest Pain   Follow-up    HPI  Jerry Hudson is a 71 y.o. male whose past medical history and cardiovascular risk factors include: Persistent atrial fibrillation, hypertension, history of prostate cancer status post robotic assisted laparoscopic prostatectomy with bilateral pelvic lymph node dissection repair, trigeminal neuralgia status post nerve decompression, history of varicose veins, obesity due to excess calories, advanced age.  Initially referred to the practice for evaluation and management of atrial fibrillation.  In July 2021, went in for routine colonoscopy and was found to be in A-fib.  Since then has been on rate control strategy and oral anticoagulation for thromboembolic prophylaxis.  Patient did undergo cardioversion which restored normal sinus rhythm but by the time he came back for follow-up he had reverted back to A-fib.  We have discussed antiarrhythmic medications in the past but patient has been hesitant.  He presents today for follow-up following left heart cath on 03/13/2022.  Overall, he is doing well without any complaints today.  He continues to tolerate anticoagulation without bleeding issues. He denies chest pain, shortness of breath, palpitations, leg edema, orthopnea, PND, TIA/syncope.  FUNCTIONAL STATUS: He does yard work regularly but no structured exercise program or daily routine.   ALLERGIES: No Known Allergies  MEDICATION LIST PRIOR TO VISIT: Current Meds  Medication Sig   allopurinol (ZYLOPRIM) 300 MG tablet Take 300 mg by mouth daily.   B Complex Vitamins (VITAMIN B COMPLEX) TABS Take 1 tablet by mouth daily.   hydrochlorothiazide (HYDRODIURIL) 25 MG tablet Take 25 mg by mouth daily.    ibuprofen (ADVIL) 200 MG tablet Take 400 mg by mouth every 6 (six) hours as needed for moderate pain or headache.   isosorbide dinitrate (ISORDIL) 30 MG tablet Take 1 tablet (30 mg total) by mouth 3 (three) times daily.   metoprolol tartrate (LOPRESSOR) 25 MG tablet Take 12.5 mg by mouth 2 (two) times daily.   olmesartan (BENICAR) 40 MG tablet Take 40 mg by mouth daily.   Oxcarbazepine (TRILEPTAL) 300 MG tablet Take 300 mg by mouth 2 (two) times daily.   rivaroxaban (XARELTO) 20 MG TABS tablet Take 20 mg by mouth daily with supper.   rosuvastatin (CRESTOR) 40 MG tablet Take 1 tablet (40 mg total) by mouth daily.   traMADol (ULTRAM) 50 MG tablet Take 50 mg by mouth every 6 (six) hours as needed for moderate pain.     PAST MEDICAL HISTORY: Past Medical History:  Diagnosis Date   Cancer [facility])    prostate cancer; pt reports he's "clean" as of his Nov 2019 check-up   Hyperlipidemia    Hypertension    Numbness and tingling    in feet bilat comes and goes    Ruptured lumbar disc    L5 ruptured, L4 deteriorated   Trauma    great right toe laceration secondary to lawnmower accident    Trigeminal neuralgia    FAMILY HISTORY: The patient family history includes Cancer in his father and mother; Diabetes in his sister.  SOCIAL HISTORY:  The patient  reports that he has never smoked. He has never used smokeless tobacco. He reports current alcohol use. He reports that he does not use drugs.  REVIEW OF SYSTEMS: Review  of Systems  Cardiovascular:  Negative for chest pain, claudication, dyspnea on exertion, irregular heartbeat, leg swelling, near-syncope, orthopnea, palpitations, paroxysmal nocturnal dyspnea and syncope.  Respiratory:  Negative for shortness of breath.   Hematologic/Lymphatic: Negative for bleeding problem.  Musculoskeletal:  Negative for muscle cramps and myalgias.  Neurological:  Negative for dizziness and light-headedness.    PHYSICAL EXAM:    04/04/2022    8:56 AM  03/13/2022    2:12 PM 03/13/2022    1:09 PM  Vitals with BMI  Height _0     Weight 269 lbs    BMI 70.62    Systolic 376 283 151  Diastolic 76 82 68  Pulse 64 47 51   Physical Exam Constitutional:      General: He is not in acute distress. Neck:     Vascular: No JVD.  Cardiovascular:     Rate and Rhythm: Bradycardia present. Rhythm irregular.     Pulses: Normal pulses and intact distal pulses.     Heart sounds: S1 normal and S2 normal. Murmur heard.     Holosystolic murmur is present with a grade of 3/6 at the apex radiating to the axilla.     No gallop. No S3 or S4 sounds.     Comments: Left radial access site well healed. No evidence of hematoma. Pulmonary:     Effort: Pulmonary effort is normal.     Breath sounds: Normal breath sounds. No stridor. No wheezing or rales.  Musculoskeletal:     Cervical back: Neck supple.     Right lower leg: No edema.     Left lower leg: No edema.  Skin:    General: Skin is warm and moist.  Neurological:     Mental Status: He is alert and oriented to person, place, and time.     Cranial Nerves: Cranial nerves 2-12 are intact.    CARDIAC DATABASE: EKG 04/04/2022: Atrial fibrillation with controlled ventricular response at 42 bpm.  Single PVC.  Left axis deviation.  Low voltage complexes, possible pulmonary disease.  Nonspecific T wave abnormality.  Compared to previous EKG on 03/08/2022, no significant change.  Echocardiogram: 08/07/2021:  Normal LV systolic function with visual EF 55-60%. Left ventricle cavity is normal in size. Mild left ventricular hypertrophy. Normal global wall motion. Normal LAP. Unable to evaluate diastolic function due to atrial  fibrillation.  Native trileaflet aortic valve.   Trace aortic regurgitation. Aortic valve sclerosis without stenosis.  Mild tricuspid regurgitation. No evidence of pulmonary hypertension.  Mild pulmonic regurgitation.  Compared to study 01/07/2020 no significant change.  TEE  05/24/2020: LVEF 55-60%, normal right ventricular size and function, no left atrial thrombus.  Left atrial appendage emptying velocity 39 cm/s, small pericardial effusion, mild MR, agitated saline study negative for PFO.  Stress Testing: Lexiscan (with Mod Bruce protocol) Nuclear stress test 02/13/2022: Myocardial perfusion is abnormal. There is a small sized reversible mild defect in the septal and apical regions.  Overall LV systolic function is normal without regional wall motion abnormalities. Stress LV EF: 57%.  Nondiagnostic ECG stress. The heart rate response was consistent with Regadenoson.  No previous exam available for comparison. Low risk.   CT CCS 02/21/2022: FINDINGS: CORONARY CALCIUM SCORES: Left Main: 131 LAD: 186 LCx: 23 RCA: 107   Total Agatston Score: 447 MESA database percentile: 73  Heart Catheterization: 03/13/2022: LM: Normal LAD: Prox 50% stenosis. RFR 0.94 (Physiologically non-significant) Lcx: Large, normal Ramus; Normal RCA: Mild luminal irregularities   LVEDP normal  Continue  medical management for CAD Reasonable to omit Aspirin given ongoing anticoagulation for Afib and absence of severe disease Given 50% LAD disease, probably avoid flecainide as AAD.  Direct-current cardioversion: 05/24/2020: 200 J of synchronized x1 converted to normal sinus rhythm.  LABORATORY DATA: External Labs: Collected: 12/04/2019 at [facility] from care everywhere Creatinine 1.3 mg/dL. eGFR: 56 mL/min per 1.73 m Hemoglobin 13.8 g/dL TSH 1.99  Collected: 08/22/2020 at [facility] available in [place]. Hemoglobin 14.7 g/dL. Hematocrit 44.5% Serum creatinine 1.01 mg/dL. AST 13, ALT 11, alkaline phosphatase 92 (within normal limits. GFR: >60. Hemoglobin A1c 6.3 Total cholesterol 155, triglycerides 124, HDL 39, LDL 96, non-HDL 116  External Labs:  02/13/2022: Sodium 140, potassium 3.9, BUN 20, creatinine 1.09,  glucose 103, EGFR 73 Hemoglobin 14.5, hematocrit 44.5, MCV 97.4, platelets 208 Cholesterol 205, triglycerides 199, HDL 35, LDL 138  IMPRESSION:    ICD-10-CM   1. Status post left heart catheterization  Z98.890     2. Elevated coronary artery calcium score  R93.1     3. Persistent atrial fibrillation (HCC)  I48.19 EKG 12-Lead    4. Long term (current) use of anticoagulants  Z79.01     5. Benign hypertension  I10     6. Mixed hyperlipidemia  E78.2 Lipid Panel With LDL/HDL Ratio      RECOMMENDATIONS: DELMAR ARRIAGA is a 71 y.o. male whose past medical history and cardiac risk factors include: Persistent atrial fibrillation, hypertension, history of prostate cancer status post robotic assisted laparoscopic prostatectomy with bilateral pelvic lymph node dissection repair, trigeminal neuralgia status post nerve decompression, history of varicose veins, obesity due to excess calories, advanced age.  Status post left heart catheterization Elevated coronary artery calcium score LHC on 03/13/2022 revealed LAD: Prox 50% stenosis. RFR 0.94 (Physiologically non-significant). Continue medical management. No recurrence of chest pain since last visit.  Persistent atrial fibrillation ([place]) Long term (current) use of anticoagulants Rate control: Metoprolol. Rhythm control: N/A. Thromboembolic prophylaxis: Xarelto CHA2DS2-VASc SCORE is 2 which correlates to 2.2% risk of stroke per year (age, HTN).  He continues to refuse antiarrhythmic medications. Remains in afib with controlled ventricular response. Tolerating Xarelto without bleeding diathesis.   Enrolled in [employer] trial during today's visit.  Benign hypertension Blood pressure is slightly elevated at today's visit.  He does monitor his blood pressure at home and readings are well controlled. Discussed importance of diet, exercise, and weight loss.  Mixed hyperlipidemia Reviewed recent labs.  LDL remains elevated.  Continue  rosuvastatin 68m. Will recheck lipid panel prior to next visit. Discussed importance of lifestyle modifications including diet and weight loss.  Assessment and plan discussed with Dr. PVirgina Jock  Follow-up after cardiac catheterization.  FINAL MEDICATION LIST END OF ENCOUNTER:   No orders of the defined types were placed in this encounter.    Current Outpatient Medications:    allopurinol (ZYLOPRIM) 300 MG tablet, Take 300 mg by mouth daily., Disp: , Rfl: 0   B Complex Vitamins (VITAMIN B COMPLEX) TABS, Take 1 tablet by mouth daily., Disp: , Rfl:    hydrochlorothiazide (HYDRODIURIL) 25 MG tablet, Take 25 mg by mouth daily., Disp: , Rfl:    ibuprofen (ADVIL) 200 MG tablet, Take 400 mg by mouth every 6 (six) hours as needed for moderate pain or headache., Disp: , Rfl:    isosorbide dinitrate (ISORDIL) 30 MG tablet, Take 1 tablet (30 mg total) by mouth 3 (three) times daily., Disp: 90 tablet, Rfl: 2  metoprolol tartrate (LOPRESSOR) 25 MG tablet, Take 12.5 mg by mouth 2 (two) times daily., Disp: , Rfl:    olmesartan (BENICAR) 40 MG tablet, Take 40 mg by mouth daily., Disp: , Rfl:    Oxcarbazepine (TRILEPTAL) 300 MG tablet, Take 300 mg by mouth 2 (two) times daily., Disp: , Rfl:    rivaroxaban (XARELTO) 20 MG TABS tablet, Take 20 mg by mouth daily with supper., Disp: , Rfl:    rosuvastatin (CRESTOR) 40 MG tablet, Take 1 tablet (40 mg total) by mouth daily., Disp: 90 tablet, Rfl: 3   traMADol (ULTRAM) 50 MG tablet, Take 50 mg by mouth every 6 (six) hours as needed for moderate pain., Disp: , Rfl:    zinc gluconate 50 MG tablet, Take 50 mg by mouth daily. (Patient not taking: Reported on 04/04/2022), Disp: , Rfl:   Orders Placed This Encounter  Procedures   Lipid Panel With LDL/HDL Ratio   EKG 12-Lead   There are no Patient Instructions on file for this visit.   --Continue cardiac medications as reconciled in final medication list. --No follow-ups on file. Or sooner if needed. --Continue  follow-up with your primary care physician regarding the management of your other chronic comorbid conditions.  Patient's questions and concerns were addressed to his satisfaction. He voices understanding of the instructions provided during this encounter.   This note was created using a voice recognition software as a result there may be grammatical errors inadvertently enclosed that do not reflect the nature of this encounter. Every attempt is made to correct such errors.    Ernst Spell, [place] Pager: [phone] Office: [phone]

## 2022-05-28 DIAGNOSIS — E7800 Pure hypercholesterolemia, unspecified: Secondary | ICD-10-CM | POA: Diagnosis not present

## 2022-06-13 ENCOUNTER — Telehealth: Payer: Self-pay

## 2022-06-13 ENCOUNTER — Ambulatory Visit: Payer: Medicare Other | Admitting: Cardiology

## 2022-06-13 VITALS — BP 145/82 | HR 71 | Resp 18 | Ht 72.0 in | Wt 264.4 lb

## 2022-06-13 DIAGNOSIS — R072 Precordial pain: Secondary | ICD-10-CM | POA: Diagnosis not present

## 2022-06-13 NOTE — Telephone Encounter (Signed)
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 He said the nurse check his pulse and it was in the low 40's. I checked his pulse with the machine and got 60 and 62, then counted it manually and got 59 bpm. The nurse over there is thinking it is his metoprolol dosage, and wants him to see if we will lower it. She can't allow him to re-certify unless his pulse is 58-60 at least.

## 2022-06-13 NOTE — Telephone Encounter (Signed)
He is on a low dose metoprolol.  Per patient when they checked his pulse at it was "40s" that was with pulse ox and BP cuff.  I suspect that it is not counting the intermittent PVCs.  I will send him w/ an EKG but it they still want his beta blocker reduced we can change it to Metoprolol 12.'5mg'$  po qam.   Dr. Terri Skains

## 2022-07-09 ENCOUNTER — Other Ambulatory Visit: Payer: Self-pay

## 2022-07-19 DIAGNOSIS — K080 Exfoliation of teeth due to systemic causes: Secondary | ICD-10-CM | POA: Diagnosis not present

## 2022-07-30 DIAGNOSIS — M25552 Pain in left hip: Secondary | ICD-10-CM | POA: Diagnosis not present

## 2022-07-30 DIAGNOSIS — M545 Low back pain, unspecified: Secondary | ICD-10-CM | POA: Diagnosis not present

## 2022-08-23 DIAGNOSIS — H43811 Vitreous degeneration, right eye: Secondary | ICD-10-CM | POA: Diagnosis not present

## 2022-08-27 DIAGNOSIS — R739 Hyperglycemia, unspecified: Secondary | ICD-10-CM | POA: Diagnosis not present

## 2022-08-27 DIAGNOSIS — I4819 Other persistent atrial fibrillation: Secondary | ICD-10-CM | POA: Diagnosis not present

## 2022-08-27 DIAGNOSIS — R202 Paresthesia of skin: Secondary | ICD-10-CM | POA: Diagnosis not present

## 2022-08-27 DIAGNOSIS — Z0000 Encounter for general adult medical examination without abnormal findings: Secondary | ICD-10-CM | POA: Diagnosis not present

## 2022-08-27 DIAGNOSIS — Z125 Encounter for screening for malignant neoplasm of prostate: Secondary | ICD-10-CM | POA: Diagnosis not present

## 2022-08-27 DIAGNOSIS — E7800 Pure hypercholesterolemia, unspecified: Secondary | ICD-10-CM | POA: Diagnosis not present

## 2022-08-27 DIAGNOSIS — D6869 Other thrombophilia: Secondary | ICD-10-CM | POA: Diagnosis not present

## 2022-08-27 DIAGNOSIS — C61 Malignant neoplasm of prostate: Secondary | ICD-10-CM | POA: Diagnosis not present

## 2022-09-10 DIAGNOSIS — H4311 Vitreous hemorrhage, right eye: Secondary | ICD-10-CM | POA: Diagnosis not present

## 2022-09-24 ENCOUNTER — Encounter: Payer: Self-pay | Admitting: Cardiology

## 2022-09-24 ENCOUNTER — Ambulatory Visit: Payer: Medicare Other | Admitting: Cardiology

## 2022-09-24 VITALS — BP 135/84 | HR 48 | Resp 18 | Ht 72.0 in | Wt 260.2 lb

## 2022-09-24 DIAGNOSIS — E119 Type 2 diabetes mellitus without complications: Secondary | ICD-10-CM | POA: Diagnosis not present

## 2022-09-24 DIAGNOSIS — E782 Mixed hyperlipidemia: Secondary | ICD-10-CM

## 2022-09-24 DIAGNOSIS — E66812 Obesity, class 2: Secondary | ICD-10-CM

## 2022-09-24 DIAGNOSIS — I4819 Other persistent atrial fibrillation: Secondary | ICD-10-CM

## 2022-09-24 DIAGNOSIS — I10 Essential (primary) hypertension: Secondary | ICD-10-CM

## 2022-09-24 DIAGNOSIS — Z7901 Long term (current) use of anticoagulants: Secondary | ICD-10-CM | POA: Diagnosis not present

## 2022-09-24 DIAGNOSIS — I2510 Atherosclerotic heart disease of native coronary artery without angina pectoris: Secondary | ICD-10-CM

## 2022-09-24 DIAGNOSIS — R931 Abnormal findings on diagnostic imaging of heart and coronary circulation: Secondary | ICD-10-CM

## 2022-09-24 MED ORDER — METOPROLOL SUCCINATE ER 25 MG PO TB24: 12.5000 mg | ORAL_TABLET | Freq: Every day | ORAL | 1 refills | Status: DC

## 2022-09-24 NOTE — Progress Notes (Signed)
Date:  09/24/2022   ID:  Jerry Hudson, DOB December 08, 1950, MRN [id number]  PCP:  Richmond Campbell., PA-C  Cardiologist:  Tessa Lerner, DO, [facility] (established care 12/24/2019)  Date: 09/24/22 Last Office Visit: 04/04/2022  Chief Complaint  Patient presents with   Atrial Fibrillation   Follow-up    HPI  Jerry Hudson is a 72 y.o. male whose past medical history and cardiovascular risk factors include: Persistent atrial fibrillation, hypertension, history of prostate cancer status post robotic assisted laparoscopic prostatectomy with bilateral pelvic lymph node dissection repair, trigeminal neuralgia status post nerve decompression, history of varicose veins, obesity due to excess calories, advanced age.  Referred to the practice for atrial fibrillation management.  In July 2021 he underwent routine colonoscopy and was found to be in A-fib.  Since then he has been on rate control strategy and oral anticoagulation for thromboembolic prophylaxis.  He has undergone cardioversion in the past but had early recurrence of atrial fibrillation by the time he came back for follow-up.  Thereafter we have discussed antiarrhythmic medications on multiple occasions but he has remained hesitant.  He presents today for 62-month follow-up visit.  He denies anginal chest pain or heart failure symptoms.  He continues to appear more tired and fatigued.  Patient states that he does sleep well but overall suspicion for sleep apnea is high.  Patient's hemoglobin A1c is now in the diabetic range and he is working on lifestyle changes and will follow-up with PCP.  Outside labs independently reviewed and noted below.  FUNCTIONAL STATUS: He does yard work regularly but no structured exercise program or daily routine.   ALLERGIES: No Known Allergies  MEDICATION LIST PRIOR TO VISIT: Current Meds  Medication Sig   allopurinol (ZYLOPRIM) 300 MG tablet Take 300 mg by mouth daily.   B Complex Vitamins (VITAMIN B  COMPLEX) TABS Take 1 tablet by mouth daily.   hydrochlorothiazide (HYDRODIURIL) 25 MG tablet Take 25 mg by mouth daily.   ibuprofen (ADVIL) 200 MG tablet Take 400 mg by mouth every 6 (six) hours as needed for moderate pain or headache.   isosorbide dinitrate (ISORDIL) 30 MG tablet TAKE 1 TABLET(30 MG) BY MOUTH THREE TIMES DAILY   metoprolol succinate (TOPROL XL) 25 MG 24 hr tablet Take 0.5 tablets (12.5 mg total) by mouth daily.   olmesartan (BENICAR) 40 MG tablet Take 40 mg by mouth daily.   Oxcarbazepine (TRILEPTAL) 300 MG tablet Take 300 mg by mouth 2 (two) times daily.   rivaroxaban (XARELTO) 20 MG TABS tablet Take 20 mg by mouth daily with supper.   rosuvastatin (CRESTOR) 40 MG tablet Take 1 tablet (40 mg total) by mouth daily.   zinc gluconate 50 MG tablet Take 50 mg by mouth daily.   [DISCONTINUED] metoprolol tartrate (LOPRESSOR) 25 MG tablet Take 12.5 mg by mouth 2 (two) times daily.     PAST MEDICAL HISTORY: Past Medical History:  Diagnosis Date   Cancer [facility])    prostate cancer; pt reports he's "clean" as of his Nov 2019 check-up   Hyperlipidemia    Hypertension    Numbness and tingling    in feet bilat comes and goes    Ruptured lumbar disc    L5 ruptured, L4 deteriorated   Trauma    great right toe laceration secondary to lawnmower accident    Trigeminal neuralgia    FAMILY HISTORY: The patient family history includes Cancer in his father and mother; Diabetes in his sister; Heart disease in  his brother.  SOCIAL HISTORY:  The patient  reports that he has never smoked. He has never used smokeless tobacco. He reports current alcohol use. He reports that he does not use drugs.  REVIEW OF SYSTEMS: Review of Systems  Constitutional: Positive for malaise/fatigue.  Cardiovascular:  Negative for chest pain, claudication, dyspnea on exertion, irregular heartbeat, leg swelling, near-syncope, orthopnea, palpitations, paroxysmal nocturnal dyspnea and syncope.  Respiratory:   Negative for shortness of breath.   Hematologic/Lymphatic: Negative for bleeding problem.  Musculoskeletal:  Negative for muscle cramps and myalgias.  Neurological:  Negative for dizziness and light-headedness.    PHYSICAL EXAM:    09/24/2022    9:53 AM 06/13/2022    2:35 PM 04/04/2022    8:56 AM  Vitals with BMI  Height 6\' 0"  6\' 0"  6\' 0"   Weight 260 lbs 3 oz 264 lbs 6 oz 269 lbs  BMI 35.28 35.85 36.48  Systolic 135 145 161  Diastolic 84 82 76  Pulse 48 71 64   Physical Exam Constitutional:      General: He is not in acute distress. Neck:     Vascular: No JVD.  Cardiovascular:     Rate and Rhythm: Bradycardia present. Rhythm irregular.     Pulses: Normal pulses and intact distal pulses.     Heart sounds: S1 normal and S2 normal. Murmur heard.     Holosystolic murmur is present with a grade of 3/6 at the apex radiating to the axilla.     No gallop. No S3 or S4 sounds.     Comments: Left radial access site well healed. No evidence of hematoma. Pulmonary:     Effort: Pulmonary effort is normal.     Breath sounds: Normal breath sounds. No stridor. No wheezing or rales.  Musculoskeletal:     Cervical back: Neck supple.     Right lower leg: No edema.     Left lower leg: No edema.  Skin:    General: Skin is warm and moist.  Neurological:     Mental Status: He is alert and oriented to person, place, and time.     Cranial Nerves: Cranial nerves 2-12 are intact.    CARDIAC DATABASE: EKG  Sep 24, 2022: Atrial fibrillation, 53 bpm, poor R wave progression, without underlying injury pattern.  No PVCs and ventricular rate is higher on current EKG.  Echocardiogram: 08/07/2021:  Normal LV systolic function with visual EF 55-60%. Left ventricle cavity is normal in size. Mild left ventricular hypertrophy. Normal global wall motion. Normal LAP. Unable to evaluate diastolic function due to atrial fibrillation.  Native trileaflet aortic valve.   Trace aortic regurgitation. Aortic valve  sclerosis without stenosis.  Mild tricuspid regurgitation. No evidence of pulmonary hypertension.  Mild pulmonic regurgitation.  Compared to study 01/07/2020 no significant change.  TEE 05/24/2020: LVEF 55-60%, normal right ventricular size and function, no left atrial thrombus.  Left atrial appendage emptying velocity 39 cm/s, small pericardial effusion, mild MR, agitated saline study negative for PFO.  Stress Testing: Lexiscan (with Mod Bruce protocol) Nuclear stress test 02/13/2022: Myocardial perfusion is abnormal. There is a small sized reversible mild defect in the septal and apical regions.  Overall LV systolic function is normal without regional wall motion abnormalities. Stress LV EF: 57%.  Nondiagnostic ECG stress. The heart rate response was consistent with Regadenoson.  No previous exam available for comparison. Low risk.   CT CCS 02/21/2022: FINDINGS: CORONARY CALCIUM SCORES: Left Main: 131 LAD: 186 LCx: 23 RCA: 107  Total Agatston Score: 447 MESA database percentile: 73  Heart Catheterization: 03/13/2022: LM: Normal LAD: Prox 50% stenosis. RFR 0.94 (Physiologically non-significant) Lcx: Large, normal Ramus; Normal RCA: Mild luminal irregularities   LVEDP normal  Continue medical management for CAD Reasonable to omit Aspirin given ongoing anticoagulation for Afib and absence of severe disease Given 50% LAD disease, probably avoid flecainide as AAD.  Direct-current cardioversion: 05/24/2020: 200 J of synchronized x1 converted to normal sinus rhythm.  LABORATORY DATA: External Labs: Collected: 12/04/2019 at [facility] from care everywhere Creatinine 1.3 mg/dL. eGFR: 56 mL/min per 1.73 m Hemoglobin 13.8 g/dL TSH 4.09  Collected: 12/15/9145 at [facility] available in Care Everywhere. Hemoglobin 14.7 g/dL. Hematocrit 44.5% Serum creatinine 1.01 mg/dL. AST 13, ALT 11, alkaline phosphatase 92 (within normal  limits. GFR: >60. Hemoglobin A1c 6.3 Total cholesterol 155, triglycerides 124, HDL 39, LDL 96, non-HDL 116  External Labs:  02/13/2022: Sodium 140, potassium 3.9, BUN 20, creatinine 1.09, glucose 103, EGFR 73 Hemoglobin 14.5, hematocrit 44.5, MCV 97.4, platelets 208 Cholesterol 205, triglycerides 199, HDL 35, LDL 138  External Labs: Collected: August 27, 2022 available in Care Everywhere. Total cholesterol 128, triglycerides 108, HDL 48, LDL calculated 61, non-HDL 80 Sodium 139, potassium 4.2, chloride 102, bicarb 28, BUN 17, creatinine 0.98. AST 17, ALT 18, alkaline phosphatase 103 Hemoglobin A1c 6.6 Hemoglobin 14.2, hematocrit 42.4%  IMPRESSION:    ICD-10-CM   1. Persistent atrial fibrillation (HCC)  I48.19 EKG 12-Lead    Ambulatory referral to Sleep Studies    metoprolol succinate (TOPROL XL) 25 MG 24 hr tablet    2. Long term (current) use of anticoagulants  Z79.01     3. Non-insulin dependent type 2 diabetes mellitus (HCC)  E11.9     4. Benign hypertension  I10     5. Mixed hyperlipidemia  E78.2     6. Coronary atherosclerosis due to calcified coronary lesion  I25.10    I25.84     7. Agatston CAC score, >400  R93.1     8. Class 2 severe obesity due to excess calories with serious comorbidity and body mass index (BMI) of 35.0 to 35.9 in adult [facility])  E66.01    Z68.35       RECOMMENDATIONS: Jerry Hudson is a 72 y.o. male whose past medical history and cardiac risk factors include: Severe coronary artery calcification (CAC 447, 73rd percentile), persistent atrial fibrillation, hypertension, history of prostate cancer status post robotic assisted laparoscopic prostatectomy with bilateral pelvic lymph node dissection repair, trigeminal neuralgia status post nerve decompression, history of varicose veins, obesity due to excess calories, advanced age.  Persistent atrial fibrillation (HCC) Long term (current) use of anticoagulants Rate control: Metoprolol. Rhythm  control: N/A. Thromboembolic prophylaxis: Xarelto [id number] SCORE is 3 which correlates to 3.2% risk of stroke per year (age, HTN,diabetes).  He continues to refuse antiarrhythmic medications. Remains in afib with controlled ventricular response. Tolerating Xarelto without bleeding diathesis.  Will reduce metoprolol to tartrate 12.5 mg p.o. twice daily to Toprol-XL 12.5 mg p.o. daily. Clinical suspicion for undiagnosed sleep apnea is relatively high given his symptoms and risk factors.  Will refer him to sleep medicine for further evaluation. Does not was evidence of bleeding. Outside labs independently reviewed.   Reemphasized the risks, benefits, and alternatives to anticoagulation.  Benign hypertension Office blood pressures within acceptable limits. Continue current medical therapy. Discussed importance of diet, exercise, and weight loss.  Mixed hyperlipidemia Reviewed recent labs.   Lipids  in January 2024 were very well-controlled. Has trended up likely secondary to dietary indiscretion. Currently focusing on improving his diet to manage his A1c. Recommend a goal LDL <70 mg/dL and if possible/tolerable less than 55 mg/dL given his CAC.  Outside lab was independently reviewed and noted above. Discussed importance of lifestyle modifications including diet and weight loss.  Coronary artery calcification Total CAC 447, 73rd percentile. Ischemic workup as outlined above.  Reviewed as part of medical decision making today. Currently on statin therapy. Reemphasized importance of glycemic control -A1c now in the diabetic range Recommend a goal LDL <70 mg/dL and if possible/tolerable less than 55 mg/dL given his CAC.   Discussed management of at least 2 chronic comorbid conditions.  Since patient does not want to focus on rhythm control we discussed the importance of improving risk factors that contribute to A-fib.  Clinical suspicion for sleep apnea is high.  We discussed how sleep  apnea plays a role in A-fib management.  Outside labs independently reviewed.  FINAL MEDICATION LIST END OF ENCOUNTER:   Meds ordered this encounter  Medications   metoprolol succinate (TOPROL XL) 25 MG 24 hr tablet    Sig: Take 0.5 tablets (12.5 mg total) by mouth daily.    Dispense:  45 tablet    Refill:  1     Current Outpatient Medications:    allopurinol (ZYLOPRIM) 300 MG tablet, Take 300 mg by mouth daily., Disp: , Rfl: 0   B Complex Vitamins (VITAMIN B COMPLEX) TABS, Take 1 tablet by mouth daily., Disp: , Rfl:    hydrochlorothiazide (HYDRODIURIL) 25 MG tablet, Take 25 mg by mouth daily., Disp: , Rfl:    ibuprofen (ADVIL) 200 MG tablet, Take 400 mg by mouth every 6 (six) hours as needed for moderate pain or headache., Disp: , Rfl:    isosorbide dinitrate (ISORDIL) 30 MG tablet, TAKE 1 TABLET(30 MG) BY MOUTH THREE TIMES DAILY, Disp: 90 tablet, Rfl: 2   metoprolol succinate (TOPROL XL) 25 MG 24 hr tablet, Take 0.5 tablets (12.5 mg total) by mouth daily., Disp: 45 tablet, Rfl: 1   olmesartan (BENICAR) 40 MG tablet, Take 40 mg by mouth daily., Disp: , Rfl:    Oxcarbazepine (TRILEPTAL) 300 MG tablet, Take 300 mg by mouth 2 (two) times daily., Disp: , Rfl:    rivaroxaban (XARELTO) 20 MG TABS tablet, Take 20 mg by mouth daily with supper., Disp: , Rfl:    rosuvastatin (CRESTOR) 40 MG tablet, Take 1 tablet (40 mg total) by mouth daily., Disp: 90 tablet, Rfl: 3   zinc gluconate 50 MG tablet, Take 50 mg by mouth daily., Disp: , Rfl:   Orders Placed This Encounter  Procedures   Ambulatory referral to Sleep Studies   EKG 12-Lead   There are no Patient Instructions on file for this visit.   --Continue cardiac medications as reconciled in final medication list. --Return in about 6 months (around 03/27/2023) for Follow up, A. fib. Or sooner if needed. --Continue follow-up with your primary care physician regarding the management of your other chronic comorbid conditions.  Patient's  questions and concerns were addressed to his satisfaction. He voices understanding of the instructions provided during this encounter.   This note was created using a voice recognition software as a result there may be grammatical errors inadvertently enclosed that do not reflect the nature of this encounter. Every attempt is made to correct such errors.    Nori Riis, [place] Pager: [phone] Office: [phone]

## 2022-10-02 DIAGNOSIS — H43811 Vitreous degeneration, right eye: Secondary | ICD-10-CM | POA: Diagnosis not present

## 2022-10-02 DIAGNOSIS — H4311 Vitreous hemorrhage, right eye: Secondary | ICD-10-CM | POA: Diagnosis not present

## 2022-10-04 ENCOUNTER — Other Ambulatory Visit: Payer: Self-pay | Admitting: Cardiology

## 2022-10-10 ENCOUNTER — Other Ambulatory Visit: Payer: Self-pay | Admitting: Cardiology

## 2022-10-31 ENCOUNTER — Ambulatory Visit (INDEPENDENT_AMBULATORY_CARE_PROVIDER_SITE_OTHER): Payer: Medicare Other | Admitting: Neurology

## 2022-10-31 ENCOUNTER — Encounter: Payer: Self-pay | Admitting: Neurology

## 2022-10-31 VITALS — BP 161/98 | HR 70 | Ht 72.0 in | Wt 261.2 lb

## 2022-10-31 DIAGNOSIS — R0683 Snoring: Secondary | ICD-10-CM

## 2022-10-31 DIAGNOSIS — T733XXS Exhaustion due to excessive exertion, sequela: Secondary | ICD-10-CM

## 2022-10-31 DIAGNOSIS — I4819 Other persistent atrial fibrillation: Secondary | ICD-10-CM

## 2022-10-31 DIAGNOSIS — T733XXA Exhaustion due to excessive exertion, initial encounter: Secondary | ICD-10-CM | POA: Insufficient documentation

## 2022-10-31 DIAGNOSIS — Z8669 Personal history of other diseases of the nervous system and sense organs: Secondary | ICD-10-CM

## 2022-10-31 DIAGNOSIS — Z79899 Other long term (current) drug therapy: Secondary | ICD-10-CM

## 2022-10-31 NOTE — Progress Notes (Signed)
SLEEP MEDICINE CLINIC    Provider:  Melvyn Novas, MD  Primary Care Physician:  Richmond Campbell., PA-C [address] [place] [address]     Referring Provider: Tessa Lerner, Do [address] [place],  [place] [address]          Chief Complaint according to patient   Patient presents with:     New Patient (Initial Visit)           HISTORY OF PRESENT ILLNESS:  Jerry Hudson is a 72 y.o. male patient who is seen upon referral on 10/31/2022 from [facility], DO  for a sleep study in a patient with atrial fibrillation. He had been a trigeminal neuralgia patient and  between 2014- 2021, he was treated locally, then referred to [facility]  for neurosurgery, blood vessel padding with  fatty tissue-  surgery worked for 3 months only , after he had a new treatment with more radiation. Gamma knife.  Chief concern according to patient :  " nobody knows how long I have had atrial fib, but it was found 3 years ago and is considered persistent" . It was found in process during a colonoscopy with Dr Loreta Ave, GI.     Jerry Hudson is seen on 10/31/22 a right -handed male with a possible sleep disorder.   Sleep relevant  ROS medical history:  wife  reports snoring, pausing in his breath, Nocturia since prostate cancer surgery, robotic, 3 times ," I have HTN"  and atrial fib, asymptomatic.  No history of Sleep walking, No Tonsillectomy, No TBI, concussions , no neck injuries.    Family medical /sleep history: one bother with  CPAP with OSA. Father died at age 23 and mother died at 20 , smoking related illness.    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  The youngest daughter of 3 is still officially  living at home.   The patient currently works- he was a night shift worker until 1990, but retired  form an Agricultural consultant.  Tobacco use: none , grew up with heavy exposure .  ETOH use :  rare , a beer now and then.. Caffeine intake in form of Coffee(  rare ) Soda( 1/ week) Tea ( 1-2  glasses a day ) or energy drinks Exercise in form of walking, but restricted by knee pain. Right knee.     Sleep habits are as follows: The patient's dinner time is between 5-7 PM. The patient goes to bed at 10 PM and continues to sleep for 3-4 hours, wakes for 2-3 bathroom breaks, the first time at 2 AM.   The preferred sleep position is laterally and prone, left preferred- , with the support of 2 pillows. Dreams are reportedly frequent.   The patient wakes up spontaneously before the alarm. 5.30  AM is the usual rise time. He reports not feeling refreshed or restored in AM, with symptoms such as dry mouth, no morning headaches. Sometimes he dozes off in a recliner- these power Naps are taken infrequently, unscheduled ,he feels even sometimes worse when he naps, less refreshing than nocturnal sleep.    Cardiology Note 04-04-2022 Jerry Hudson is a 72 y.o. male whose past medical history and cardiovascular risk factors include: Persistent atrial fibrillation,  hypertension, history of prostate cancer status post robotic assisted laparoscopic prostatectomy with bilateral pelvic lymph node dissection repair, trigeminal neuralgia status post nerve decompression, history of varicose veins, obesity due to excess calories, advanced age.   Initially referred to the practice for evaluation and management of atrial fibrillation.  In July 2021, went in for routine colonoscopy and was found to be in A-fib.  Since then has been on rate control strategy and oral anticoagulation for thromboembolic prophylaxis.  Patient did undergo cardioversion which restored normal sinus rhythm but by the time he came back for follow-up he had reverted back to A-fib.  We have discussed antiarrhythmic medications in the past but patient has been  hesitant.   He presents today for follow-up following left heart cath on 03/13/2022.  Overall, he is doing well without any complaints today.  He continues to tolerate anticoagulation without bleeding issues. He denies chest pain, shortness of breath, palpitations, leg edema, orthopnea, PND, TIA/syncope.   Review of Systems: Out of a complete 14 system review, the patient complains of only the following symptoms, and all other reviewed systems are negative.:  Fatigue, sleepiness , snoring, fragmented sleep, TIRED. Nocturia    How likely are you to doze in the following situations: 0 = not likely, 1 = slight chance, 2 = moderate chance, 3 = high chance   Sitting and Reading? Watching Television? Sitting inactive in a public place (theater or meeting)? As a passenger in a car for an hour without a break? Lying down in the afternoon when circumstances permit? Sitting and talking to someone? Sitting quietly after lunch without alcohol? In a car, while stopped for a few minutes in traffic?   Total = 1/ 24 points   FSS endorsed at 20/ 63 points.   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   Right handed   Caffeine: maybe 3-4 cups a week    Social Determinants of Health   Financial Resource Strain: Not on file  Food  Insecurity: Not on file  Transportation Needs: Not on file  Physical Activity: Not on file  Stress: Not on file  Social Connections: Not on file    Family History  Problem Relation Age of Onset   Cancer Mother    Cancer Father    Diabetes Sister    Heart disease Brother     Past Medical History:  Diagnosis Date   Cancer [facility])    prostate cancer; pt reports he's "clean" as of his Nov 2019 check-up   Hyperlipidemia    Hypertension    Numbness and tingling    in feet bilat  comes and goes    Ruptured lumbar disc    L5 ruptured, L4 deteriorated   Trauma    great right toe laceration secondary to lawnmower accident    Trigeminal neuralgia     Past Surgical History:  Procedure Laterality Date   BUBBLE STUDY  05/24/2020   Procedure: BUBBLE STUDY;  Surgeon: Tessa Lerner, DO;  Location: [facility] END[facility];  Service: Cardiovascular;;   CARDIOVERSION N/A 05/24/2020   Procedure: CARDIOVERSION;  Surgeon: Tessa Lerner, DO;  Location: [facility];  Service: Cardiovascular;  Laterality: N/A;   CORONARY PRESSURE/FFR STUDY N/A 03/13/2022   Procedure: INTRAVASCULAR PRESSURE WIRE/FFR STUDY;  Surgeon: Elder Negus, MD;  Location: [facility] INVASIVE CV LAB;  Service: Cardiovascular;  Laterality: N/A;   KNEE ARTHROSCOPY  2006   both knees   LEFT HEART CATH AND CORONARY ANGIOGRAPHY N/A 03/13/2022   Procedure: LEFT HEART CATH AND CORONARY ANGIOGRAPHY;  Surgeon: Elder Negus, MD;  Location: [facility] INVASIVE CV LAB;  Service: Cardiovascular;  Laterality: N/A;   LYMPHADENECTOMY Bilateral 04/20/2015   Procedure: BILATERAL LYMPHADENECTOMY;  Surgeon: Sebastian Ache, MD;  Location: [facility] ORS;  Service: Urology;  Laterality: Bilateral;   ROBOT ASSISTED LAPAROSCOPIC RADICAL PROSTATECTOMY N/A 04/20/2015   Procedure: ROBOTIC ASSISTED LAPAROSCOPIC RADICAL PROSTATECTOMY LEVEL 2;  Surgeon: Sebastian Ache, MD;  Location: [facility] OR[facility];  Service: Urology;  Laterality: N/A;   TEE WITHOUT CARDIOVERSION N/A 05/24/2020    Procedure: TRANSESOPHAGEAL ECHOCARDIOGRAM (TEE);  Surgeon: Tessa Lerner, DO;  Location: [facility];  Service: Cardiovascular;  Laterality: N/A;   TRIGEMINAL NERVE DECOMPRESSION       Current Outpatient Medications on File Prior to Visit  Medication Sig Dispense Refill   allopurinol (ZYLOPRIM) 300 MG tablet Take 300 mg by mouth daily.  0   B Complex Vitamins (VITAMIN B COMPLEX) TABS Take 1 tablet by mouth daily.     hydrochlorothiazide (HYDRODIURIL) 25 MG tablet Take 25 mg by mouth daily.     ibuprofen (ADVIL) 200 MG tablet Take 400 mg by mouth every 6 (six) hours as needed for moderate pain or headache.     isosorbide dinitrate (ISORDIL) 30 MG tablet TAKE 1 TABLET(30 MG) BY MOUTH THREE TIMES DAILY 90 tablet 2   metoprolol succinate (TOPROL XL) 25 MG 24 hr tablet Take 0.5 tablets (12.5 mg total) by mouth daily. 45 tablet 1   olmesartan (BENICAR) 40 MG tablet Take 40 mg by mouth daily.     Oxcarbazepine (TRILEPTAL) 300 MG tablet Take 300 mg by mouth 2 (two) times daily.     rivaroxaban (XARELTO) 20 MG TABS tablet Take 20 mg by mouth daily with supper.     rosuvastatin (CRESTOR) 40 MG tablet Take 1 tablet (40 mg total) by mouth daily. 90 tablet 3   zinc gluconate 50 MG tablet Take 50 mg by mouth daily.     No current facility-administered medications on file prior to visit.    No Known Allergies   DIAGNOSTIC DATA (LABS, IMAGING, TESTING) - I reviewed patient records, labs, notes, testing and imaging myself where available.  Lab Results  Component Value Date   WBC 6.8 05/18/2020   HGB 14.1 05/18/2020   HCT 40.6 05/18/2020   MCV 86 05/18/2020   PLT 201 05/18/2020      Component Value Date/Time   NA 141 05/18/2020 1019   K 4.0 05/18/2020 1019   CL 106 05/18/2020 1019   CO2 21 05/18/2020 1019   GLUCOSE 118 (H) 05/18/2020 1019   GLUCOSE 149 (H) 04/21/2015 0506  BUN 18 05/18/2020 1019   CREATININE 1.01 05/18/2020 1019   CALCIUM 9.4 05/18/2020 1019   GFRNONAA 77 05/18/2020 1019    GFRAA 89 05/18/2020 1019   PHYSICAL EXAM:  Today's Vitals   10/31/22 0833  BP: (!) 161/98  Pulse: 70  Weight: 261 lb 3.2 oz (118.5 kg)  Height: 6' (1.829 m)   Body mass index is 35.43 kg/m.   Wt Readings from Last 3 Encounters:  10/31/22 261 lb 3.2 oz (118.5 kg)  09/24/22 260 lb 3.2 oz (118 kg)  06/13/22 264 lb 6.4 oz (119.9 kg)     Ht Readings from Last 3 Encounters:  10/31/22 6' (1.829 m)  09/24/22 6' (1.829 m)  06/13/22 6' (1.829 m)      General: The patient is awake, alert and appears not in acute distress. The patient is well groomed. Head: Normocephalic, atraumatic. Neck is supple.  Mallampati 3 plus- there is a solid wall , no uvula seen-,  neck circumference:18 inches . Nasal airflow  patent.  Retrognathia is not seen.  Dental status: biological  Cardiovascular:  Regular rate and cardiac rhythm by pulse,  without distended neck veins. Respiratory: Lungs are clear to auscultation.  Skin:  Without evidence of ankle edema, or rash. Trunk: The patient's posture is erect.   NEUROLOGIC EXAM: The patient is awake and alert, oriented to place and time.   Memory subjective described as intact.  Attention span & concentration ability appears normal.  Speech is fluent,  without  dysarthria, dysphonia or aphasia.  Mood and affect are appropriate.   Cranial nerves: no loss of smell or taste reported  Pupils are equal and briskly reactive to light. Funduscopic exam deferred.  Right eye floater.  Extraocular movements in vertical and horizontal planes were intact and without nystagmus. No Diplopia. Visual fields by finger perimetry are intact. Hearing was impaired to soft voice   reports ringing in his ears.  Facial sensation intact to fine touch.  Facial motor strength is symmetric and tongue and uvula move midline.  Neck ROM : rotation, tilt and flexion extension were normal for age and shoulder shrug was symmetrical.    Motor exam:  Symmetric bulk, tone and ROM.    Normal tone without cog- wheeling, symmetric grip strength .   Sensory:  Fine touch, pinprick and vibration were tested  and  normal.  Proprioception tested in the upper extremities was normal.   Coordination: Rapid alternating movements in the fingers/hands were of normal speed.  The Finger-to-nose maneuver was intact without evidence of ataxia, dysmetria or tremor.   Gait and station: Patient could rise unassisted from a seated position, walked without assistive device.  Deep tendon reflexes: in the  upper and lower extremities are symmetric and intact.    ASSESSMENT AND PLAN 72 year old male  here with: recent left heart cath, after chest pain.     1) persistent atrial fibrillation, HTN and non restorative sleep. Snoring witnessed by wife and also irregular breathing.  Suspect for OSA with the risk factors with large neck and high grade Mallampati   2) he denies excessive daytime sleepiness,  but feels fatigued, no palpitations or diaphoresis, no syncope.  He has been lightheaded and repots dizziness after being exposed to heat and physically active.   HST was ordered.  We agreed to have a HST for screening for OSA but he is not interested in an in-lab study- which I had offered.   I plan to follow up either personally  or through our NP within 3-5 months.   I would like to thank Richmond Campbell., PA-C and Tessa Lerner, Do [address] [place],  [place] [address] for allowing me to meet with and to take care of this pleasant patient.   After spending a total time of  45  minutes face to face and additional time for physical and neurologic examination, review of laboratory studies,  personal review of imaging studies, reports and results of other testing and review of referral information / records as far as provided in visit,   Electronically signed by: Melvyn Novas, MD 10/31/2022 9:02 AM  [facility] Board certified by The  [employer] of Sleep Medicine and Diplomate of the [employer] of Sleep Medicine. Board certified In Neurology through the ABPN, Fellow of the [employer] of Neurology.

## 2022-10-31 NOTE — Patient Instructions (Signed)
Screening for Sleep Apnea  Sleep apnea is a condition in which breathing pauses or becomes shallow during sleep. Sleep apnea screening is a test to determine if you are at risk for sleep apnea. The test includes a series of questions. It will only takes a few minutes. Your health care provider may ask you to have this test in preparation for surgery or as part of a physical exam. What are the symptoms of sleep apnea? Common symptoms of sleep apnea include: Snoring. Waking up often at night. Daytime sleepiness. Pauses in breathing. Choking or gasping during sleep. Irritability. Forgetfulness. Trouble thinking clearly. Depression. Personality changes. Most people with sleep apnea do not know that they have it. What are the advantages of sleep apnea screening? Getting screened for sleep apnea can help: Ensure your safety. It is important for your health care providers to know whether or not you have sleep apnea, especially if you are having surgery or have other long-term (chronic) health conditions. Improve your health and allow you to get a better night's rest. Restful sleep can help you: Have more energy. Lose weight. Improve high blood pressure. Improve diabetes management. Prevent stroke. Prevent car accidents. What happens during the screening? Screening usually includes being asked a list of questions about your sleep quality. Some questions you may be asked include: Do you snore? Is your sleep restless? Do you have daytime sleepiness? Has a partner or spouse told you that you stop breathing during sleep? Have you had trouble concentrating or memory loss? What is your age? What is your neck circumference? To measure your neck, keep your back straight and gently wrap the tape measure around your neck. Put the tape measure at the middle of your neck, between your chin and collarbone. What is your sex assigned at birth? Do you have or are you being treated for high blood  pressure? If your screening test is positive, you are at risk for the condition. Further testing may be needed to confirm a diagnosis of sleep apnea. Where to find more information You can find screening tools online or at your health care clinic. For more information about sleep apnea screening and healthy sleep, visit these websites: Centers for Disease Control and Prevention: [employer].br American Sleep Apnea Association: www.sleepapnea.org Contact a health care provider if: You think that you may have sleep apnea. Summary Sleep apnea screening can help determine if you are at risk for sleep apnea. It is important for your health care providers to know whether or not you have sleep apnea, especially if you are having surgery or have other chronic health conditions. You may be asked to take a screening test for sleep apnea in preparation for surgery or as part of a physical exam. This information is not intended to replace advice given to you by your health care provider. Make sure you discuss any questions you have with your health care provider. Document Revised: 04/01/2020 Document Reviewed: 04/01/2020 Elsevier Patient Education  2024 Elsevier Inc. ASSESSMENT AND PLAN 72 year old male  here with: recent left heart cath, after chest pain.      1) persistent atrial fibrillation, HTN and non restorative sleep. Snoring witnessed by wife and also irregular breathing.  Suspect for OSA with the risk factors with large neck and high grade Mallampati    2) he denies excessive daytime sleepiness,  but feels fatigued, no palpitations or diaphoresis, no syncope.  He has been lightheaded and repots dizziness after being exposed to heat and physically  active.      HST was ordered.      I plan to follow up either personally or through our NP within 3-5 months.    I would like to thank Richmond Campbell., PA-C and Tessa Lerner, Do [address] [place],  [place] [address] for allowing me  to meet with and to take care of this pleasant patient.

## 2022-11-02 DIAGNOSIS — H43811 Vitreous degeneration, right eye: Secondary | ICD-10-CM | POA: Diagnosis not present

## 2022-11-16 ENCOUNTER — Other Ambulatory Visit: Payer: Self-pay

## 2022-12-05 DIAGNOSIS — K080 Exfoliation of teeth due to systemic causes: Secondary | ICD-10-CM | POA: Diagnosis not present

## 2022-12-21 ENCOUNTER — Other Ambulatory Visit: Payer: Self-pay | Admitting: Cardiology

## 2022-12-21 DIAGNOSIS — I4819 Other persistent atrial fibrillation: Secondary | ICD-10-CM

## 2023-01-04 DIAGNOSIS — H43811 Vitreous degeneration, right eye: Secondary | ICD-10-CM | POA: Diagnosis not present

## 2023-01-07 NOTE — Progress Notes (Signed)
See telephone encounter for that day.   Closing the chart.   Tessa Lerner, [place], [facility]  Pager:  [phone] Office: [phone]

## 2023-01-08 DIAGNOSIS — E119 Type 2 diabetes mellitus without complications: Secondary | ICD-10-CM | POA: Diagnosis not present

## 2023-02-15 DIAGNOSIS — M1991 Primary osteoarthritis, unspecified site: Secondary | ICD-10-CM | POA: Diagnosis not present

## 2023-02-15 DIAGNOSIS — M1A09X0 Idiopathic chronic gout, multiple sites, without tophus (tophi): Secondary | ICD-10-CM | POA: Diagnosis not present

## 2023-03-18 ENCOUNTER — Ambulatory Visit: Payer: Self-pay | Admitting: Cardiology

## 2023-03-18 DIAGNOSIS — M1A09X0 Idiopathic chronic gout, multiple sites, without tophus (tophi): Secondary | ICD-10-CM | POA: Diagnosis not present

## 2023-03-18 DIAGNOSIS — R7301 Impaired fasting glucose: Secondary | ICD-10-CM | POA: Diagnosis not present

## 2023-03-18 DIAGNOSIS — E7800 Pure hypercholesterolemia, unspecified: Secondary | ICD-10-CM | POA: Diagnosis not present

## 2023-03-18 DIAGNOSIS — H5203 Hypermetropia, bilateral: Secondary | ICD-10-CM | POA: Diagnosis not present

## 2023-03-18 DIAGNOSIS — Z23 Encounter for immunization: Secondary | ICD-10-CM | POA: Diagnosis not present

## 2023-03-18 DIAGNOSIS — G500 Trigeminal neuralgia: Secondary | ICD-10-CM | POA: Diagnosis not present

## 2023-04-01 ENCOUNTER — Encounter: Payer: Self-pay | Admitting: Cardiology

## 2023-04-01 ENCOUNTER — Ambulatory Visit: Payer: Medicare Other | Attending: Cardiology | Admitting: Cardiology

## 2023-04-01 VITALS — BP 138/80 | HR 43 | Ht 72.0 in | Wt 265.4 lb

## 2023-04-01 DIAGNOSIS — Z7901 Long term (current) use of anticoagulants: Secondary | ICD-10-CM | POA: Diagnosis not present

## 2023-04-01 DIAGNOSIS — I10 Essential (primary) hypertension: Secondary | ICD-10-CM | POA: Diagnosis not present

## 2023-04-01 DIAGNOSIS — E782 Mixed hyperlipidemia: Secondary | ICD-10-CM | POA: Diagnosis not present

## 2023-04-01 DIAGNOSIS — I4819 Other persistent atrial fibrillation: Secondary | ICD-10-CM

## 2023-04-01 DIAGNOSIS — E119 Type 2 diabetes mellitus without complications: Secondary | ICD-10-CM

## 2023-04-01 DIAGNOSIS — E66812 Obesity, class 2: Secondary | ICD-10-CM

## 2023-04-01 DIAGNOSIS — I2510 Atherosclerotic heart disease of native coronary artery without angina pectoris: Secondary | ICD-10-CM

## 2023-04-01 DIAGNOSIS — I2584 Coronary atherosclerosis due to calcified coronary lesion: Secondary | ICD-10-CM

## 2023-04-01 DIAGNOSIS — Z6835 Body mass index (BMI) 35.0-35.9, adult: Secondary | ICD-10-CM

## 2023-04-01 MED ORDER — ISOSORBIDE DINITRATE 30 MG PO TABS: 30.0000 mg | ORAL_TABLET | Freq: Three times a day (TID) | ORAL | 3 refills | Status: DC

## 2023-04-01 NOTE — Progress Notes (Signed)
Cardiology Office Note:  .   Date:  04/01/2023  ID:  Jerry Hudson, DOB Sep 26, 1950, MRN [id number] PCP:  Richmond Campbell PA-C  Former Cardiology Providers: N/A [facility] Providers Cardiologist:  Tessa Lerner, DO , [facility] (established care August 2021) Electrophysiologist:  None  Click to update primary MD,subspecialty MD or APP then REFRESH:1}    Chief Complaint  Patient presents with   Follow-up    33-month follow-up for atrial fibrillation    History of Present Illness: .   Jerry Hudson is a 72 y.o.  male whose past medical history and cardiovascular risk factors includes: Persistent atrial fibrillation, hypertension, history of prostate cancer status post robotic assisted laparoscopic prostatectomy with bilateral pelvic lymph node dissection repair, trigeminal neuralgia status post nerve decompression, history of varicose veins, obesity due to excess calories, advanced age.   Patient being followed by the practice for atrial fibrillation management.  Diagnosed in July 2021 when he was undergoing routine colonoscopy.  And since then he has been on rate control strategy as well as oral anticoagulation for thromboembolic prophylaxis.  He did undergo cardioversion x 1 but had early recurrence of A-fib and since then he is been managed medically.  Patient presents today for 80-month follow-up visit.  Last 6 months he denies anginal chest pain or heart failure symptoms.  Overall functional capacity remains stable.  Chronic fatigue without any exacerbation of near-syncope or syncopal events.  Review of Systems: .   Review of Systems  Constitutional: Positive for malaise/fatigue.  Cardiovascular:  Negative for chest pain, claudication, dyspnea on exertion, irregular heartbeat, leg swelling, near-syncope, orthopnea, palpitations, paroxysmal nocturnal dyspnea and syncope.  Respiratory:  Negative for shortness of breath.   Hematologic/Lymphatic: Negative for bleeding  problem.  Musculoskeletal:  Negative for muscle cramps and myalgias.  Neurological:  Negative for dizziness and light-headedness.    Studies Reviewed:   EKG: EKG Interpretation Date/Time:  Monday April 01 2023 08:29:11 EST Text Interpretation: Atrial fibrillation with slow ventricular response Anterior infarct , age undetermined When compared with ECG of 13-Mar-2022 12:03, Vent. rate has decreased BY  29 BPM   QT has shortened  Confirmed by Tessa Lerner [phone]) on 04/01/2023 8:45:26 AM  Echocardiogram: 08/07/2021:  Normal LV systolic function with visual EF 55-60%. Left ventricle cavity is normal in size. Mild left ventricular hypertrophy. Normal global wall motion. Normal LAP. Unable to evaluate diastolic function due to atrial fibrillation.  Native trileaflet aortic valve.   Trace aortic regurgitation. Aortic valve sclerosis without stenosis.  Mild tricuspid regurgitation. No evidence of pulmonary hypertension.  Mild pulmonic regurgitation.  Compared to study 01/07/2020 no significant change.  Lexiscan (with Mod Bruce protocol) Nuclear stress test 02/13/2022: Myocardial perfusion is abnormal. There is a small sized reversible mild defect in the septal and apical regions.  Overall LV systolic function is normal without regional wall motion abnormalities. Stress LV EF: 57%.  Nondiagnostic ECG stress. The heart rate response was consistent with Regadenoson.  No previous exam available for comparison. Low risk.    CT CCS 02/21/2022: FINDINGS: CORONARY CALCIUM SCORES: Left Main: 131 LAD: 186 LCx: 23 RCA: 107   Total Agatston Score: 447 MESA database percentile: 73   Heart Catheterization: 03/13/2022: LM: Normal LAD: Prox 50% stenosis. RFR 0.94 (Physiologically non-significant) Lcx: Large, normal Ramus; Normal RCA: Mild luminal irregularities   LVEDP normal   Continue medical management for CAD Reasonable to omit Aspirin given ongoing anticoagulation for Afib and  absence of severe disease Given 50% LAD  disease, probably avoid flecainide as AAD.   Direct-current cardioversion: 05/24/2020: 200 J of synchronized x1 converted to normal sinus rhythm.  RADIOLOGY: N/A  Risk Assessment/Calculations:   Click Here to Calculate/Change CHADS2VASc Score The patient's CHADS2-VASc score is 3, indicating a 3.2% annual risk of stroke.   CHF History: No HTN History: Yes Diabetes History: Yes Stroke History: No Vascular Disease History: No  Labs:    External Labs: Collected: April 2024: Total cholesterol 128, triglycerides 108, HDL 48, LDL 61, non-HDL 80  Collected: September 2024 Care Everywhere Potassium 3.7. eGFR 73. BUN 18, creatinine 1.09 Hemoglobin A1c 6.5  Physical Exam:    Today's Vitals   04/01/23 0825  BP: 138/80  Pulse: (!) 43  SpO2: 96%  Weight: 265 lb 6.4 oz (120.4 kg)  Height: 6' (1.829 m)   Body mass index is 35.99 kg/m. Wt Readings from Last 3 Encounters:  04/01/23 265 lb 6.4 oz (120.4 kg)  10/31/22 261 lb 3.2 oz (118.5 kg)  09/24/22 260 lb 3.2 oz (118 kg)    Physical Exam  Constitutional: No distress.  Age appropriate, hemodynamically stable.   Neck: No JVD present.  Cardiovascular: S1 normal, S2 normal, intact distal pulses and normal pulses. An irregularly irregular rhythm present. Bradycardia present. Exam reveals no gallop, no S3 and no S4.  Murmur heard. Holosystolic murmur is present with a grade of 3/6 at the apex radiating to the axilla. Varicose veins bilaterally.  Pulmonary/Chest: Effort normal and breath sounds normal. No stridor. He has no wheezes. He has no rales.  Abdominal: Soft. Bowel sounds are normal. He exhibits no distension. There is no abdominal tenderness.  Musculoskeletal:        General: No edema.     Cervical back: Neck supple.  Neurological: He is alert and oriented to person, place, and time. He has intact cranial nerves (2-12).  Skin: Skin is warm and moist.    Impression &  Recommendation(s):  Impression:   ICD-10-CM   1. Persistent atrial fibrillation (HCC)  I48.19 EKG 12-Lead    2. Long term (current) use of anticoagulants  Z79.01     3. Benign hypertension  I10 isosorbide dinitrate (ISORDIL) 30 MG tablet    4. Mixed hyperlipidemia  E78.2     5. Non-insulin dependent type 2 diabetes mellitus (HCC)  E11.9     6. Coronary atherosclerosis due to calcified coronary lesion  I25.10    I25.84     7. Class 2 severe obesity due to excess calories with serious comorbidity and body mass index (BMI) of 35.0 to 35.9 in adult [facility])  Z61.096    E66.01    Z68.35        Recommendation(s):  Persistent atrial fibrillation (HCC) Likely now permanent. Rate control: Toprol-XL, will discontinue now due to bradycardia. Rhythm control: N/A. Thromboembolic prophylaxis: Xarelto.  Long term (current) use of anticoagulants Outside labs from Care Everywhere reviewed and noted above for further reference. He has repeat labs with PCP in December 2024-have advised him to have his hemoglobin checked.  He does not endorse evidence of bleeding Risks, benefits, alternatives to oral anticoagulation discussed  Benign hypertension Office blood pressures are acceptable. Continue present chlorothiazide 25 mg p.o. every day. Refill isosorbide dinitrate 30 mg p.o. 3 times daily. Continue olmesartan 40 mg p.o. daily  Mixed hyperlipidemia Currently on rosuvastatin 40 mg p.o. nightly.   He denies myalgia or other side effects. Most recent lipids dated April 2024, independently reviewed as noted above.  Non-insulin dependent type  2 diabetes mellitus (HCC) Hemoglobin A1c remains 6.5. Currently not on pharmacological therapy. Has a follow-up appointment with PCP in the next coming weeks  Coronary atherosclerosis due to calcified coronary lesion Denies anginal chest pain. Has undergone appropriate ischemic workup in the past. Reemphasized the importance of secondary prevention with  focus on improving her modifiable cardiovascular risk factors such as glycemic control, lipid management, blood pressure control, weight loss.  Class 2 severe obesity due to excess calories with serious comorbidity and body mass index (BMI) of 35.0 to 35.9 in adult [facility]) Body mass index is 35.99 kg/m. I reviewed with him importance of diet, regular physical activity/exercise, weight loss.   Patient is educated on the importance of increasing physical activity gradually as tolerated with a goal of moderate intensity exercise for 30 minutes a day 5 days a week.  Orders Placed:  Orders Placed This Encounter  Procedures   EKG 12-Lead   As part of medical decision making outside labs from September 2024 in Care Everywhere independently reviewed, EKG, prior echo and ischemic workup were reviewed during today's encounter  Final Medication List:    Meds ordered this encounter  Medications   isosorbide dinitrate (ISORDIL) 30 MG tablet    Sig: Take 1 tablet (30 mg total) by mouth 3 (three) times daily.    Dispense:  180 tablet    Refill:  3    ZERO refills remain on this prescription. Your patient is requesting advance approval of refills for this medication to PREVENT ANY MISSED DOSES    Medications Discontinued During This Encounter  Medication Reason   metoprolol succinate (TOPROL-XL) 25 MG 24 hr tablet Discontinued by provider   isosorbide dinitrate (ISORDIL) 30 MG tablet Reorder     Current Outpatient Medications:    allopurinol (ZYLOPRIM) 300 MG tablet, Take 300 mg by mouth daily., Disp: , Rfl: 0   B Complex Vitamins (VITAMIN B COMPLEX) TABS, Take 1 tablet by mouth daily., Disp: , Rfl:    hydrochlorothiazide (HYDRODIURIL) 25 MG tablet, Take 25 mg by mouth daily., Disp: , Rfl:    ibuprofen (ADVIL) 200 MG tablet, Take 400 mg by mouth every 6 (six) hours as needed for moderate pain or headache., Disp: , Rfl:    olmesartan (BENICAR) 40 MG tablet, Take 40 mg by mouth daily., Disp: , Rfl:     Oxcarbazepine (TRILEPTAL) 300 MG tablet, Take 300 mg by mouth 2 (two) times daily., Disp: , Rfl:    rosuvastatin (CRESTOR) 40 MG tablet, TAKE 1 TABLET(40 MG) BY MOUTH DAILY, Disp: 90 tablet, Rfl: 1   zinc gluconate 50 MG tablet, Take 50 mg by mouth daily., Disp: , Rfl:    isosorbide dinitrate (ISORDIL) 30 MG tablet, Take 1 tablet (30 mg total) by mouth 3 (three) times daily., Disp: 180 tablet, Rfl: 3   rivaroxaban (XARELTO) 20 MG TABS tablet, Take 20 mg by mouth daily with supper., Disp: , Rfl:   Consent:   N/A  Disposition:   6 months sooner if needed Patient may be asked to follow-up sooner based on the results of the above-mentioned testing.  His questions and concerns were addressed to his satisfaction. He voices understanding of the recommendations provided during this encounter.    Signed, Tessa Lerner, DO, [facility]  [facility]  [address] [place], [place] [address] 04/01/2023 6:37 PM

## 2023-04-01 NOTE — Patient Instructions (Addendum)
Medication Instructions:  Your physician has recommended you make the following change in your medication:   STOP Metoprolol Succinate (Toprol-XL)  Refill for Isosorbide (Isordil)  was sent to your pharmacy.   *If you need a refill on your cardiac medications before your next appointment, please call your pharmacy*  Lab Work: None ordered today. If you have labs (blood work) drawn today and your tests are completely normal, you will receive your results only by: MyChart Message (if you have MyChart) OR A paper copy in the mail If you have any lab test that is abnormal or we need to change your treatment, we will call you to review the results.  Testing/Procedures: None ordered today.  Follow-Up: At [facility], you and your health needs are our priority.  As part of our continuing mission to provide you with exceptional heart care, we have created designated Provider Care Teams.  These Care Teams include your primary Cardiologist (physician) and Advanced Practice Providers (APPs -  Physician Assistants and Nurse Practitioners) who all work together to provide you with the care you need, when you need it.    Your next appointment:   6 month(s)  The format for your next appointment:   In Person  Provider:   Tessa Lerner, DO {

## 2023-04-15 DIAGNOSIS — R7301 Impaired fasting glucose: Secondary | ICD-10-CM | POA: Diagnosis not present

## 2023-04-15 DIAGNOSIS — E7800 Pure hypercholesterolemia, unspecified: Secondary | ICD-10-CM | POA: Diagnosis not present

## 2023-05-20 DIAGNOSIS — K080 Exfoliation of teeth due to systemic causes: Secondary | ICD-10-CM | POA: Diagnosis not present

## 2023-05-29 ENCOUNTER — Ambulatory Visit: Payer: Medicare Other | Attending: Cardiology | Admitting: Cardiology

## 2023-05-29 ENCOUNTER — Encounter: Payer: Self-pay | Admitting: Cardiology

## 2023-05-29 ENCOUNTER — Telehealth: Payer: Self-pay | Admitting: Cardiology

## 2023-05-29 VITALS — BP 152/92 | HR 74 | Resp 16 | Ht 72.0 in | Wt 265.0 lb

## 2023-05-29 DIAGNOSIS — Z7901 Long term (current) use of anticoagulants: Secondary | ICD-10-CM

## 2023-05-29 DIAGNOSIS — I10 Essential (primary) hypertension: Secondary | ICD-10-CM | POA: Diagnosis not present

## 2023-05-29 DIAGNOSIS — E782 Mixed hyperlipidemia: Secondary | ICD-10-CM

## 2023-05-29 DIAGNOSIS — E119 Type 2 diabetes mellitus without complications: Secondary | ICD-10-CM

## 2023-05-29 DIAGNOSIS — Z6835 Body mass index (BMI) 35.0-35.9, adult: Secondary | ICD-10-CM

## 2023-05-29 DIAGNOSIS — I4819 Other persistent atrial fibrillation: Secondary | ICD-10-CM

## 2023-05-29 DIAGNOSIS — I200 Unstable angina: Secondary | ICD-10-CM | POA: Diagnosis not present

## 2023-05-29 DIAGNOSIS — E66812 Obesity, class 2: Secondary | ICD-10-CM

## 2023-05-29 MED ORDER — ASPIRIN 81 MG PO TBEC: 81.0000 mg | DELAYED_RELEASE_TABLET | Freq: Every day | ORAL | Status: DC

## 2023-05-29 MED ORDER — NITROGLYCERIN 0.4 MG SL SUBL: 0.4000 mg | SUBLINGUAL_TABLET | SUBLINGUAL | 0 refills | Status: AC | PRN

## 2023-05-29 NOTE — Telephone Encounter (Signed)
Spoke with pt who complains of 4 episodes of chest pain/pressure with nausea and lightheadedness over the past 2 weeks.  Pt denies current CP or other symptoms at this time.  Pt reports he is taking his medications as prescribed.  Current BP 158/97 and HR - 63.  Pt reports last episode of CP this morning lasting for 45 minutes.  Reveiwed ED precautions with pt and advised will discuss with Dr Odis Hollingshead for further review and recommendations.  Pt verbalzies understanding and agrees with current plan.

## 2023-05-29 NOTE — Telephone Encounter (Signed)
Called pt and he stated he is not having active CP at this moment but had been. Asked pt if he would like to come in to see Dr. Odis Hollingshead at 4 PM today since we had an open slot and he agreed. Advised pt that if he has anymore CP episodes between now and the appt time, he needs to call EMS. Pt verbalized understanding.

## 2023-05-29 NOTE — Progress Notes (Signed)
Cardiology Office Note:  .   Date:  05/29/2023  ID:  RYKAN VITTETOE, DOB 12-Jul-1950, MRN [id number] PCP:  Richmond Campbell PA-C  Former Cardiology Providers: N/A [facility] Providers Cardiologist:  Tessa Lerner, DO , [facility] (established care August 2021) Electrophysiologist:  None  Click to update primary MD,subspecialty MD or APP then REFRESH:1}    Chief Complaint  Patient presents with   Chest Pain    History of Present Illness: .   KAESEN SHELLENBARGER is a 73 y.o.  male whose past medical history and cardiovascular risk factors includes: Persistent atrial fibrillation, hypertension, history of prostate cancer status post robotic assisted laparoscopic prostatectomy with bilateral pelvic lymph node dissection repair, trigeminal neuralgia status post nerve decompression, history of varicose veins, obesity due to excess calories, advanced age.   Patient being followed by the practice for atrial fibrillation management.  Diagnosed in July 2021 when he was undergoing routine colonoscopy.  And since then he has been on rate control strategy as well as oral anticoagulation for thromboembolic prophylaxis.  He did undergo cardioversion x 1 but had early recurrence of A-fib and since then he is been managed medically.  He was seen in November 2024 as regular follow-up for his persistent atrial fibrillation but now presents today for sick visit for evaluation of chest pain.  Patient is accompanied by his wife at today's office visit.  Chest pain: Ongoing for the last couple weeks. Substernally located, pressure-like sensation, associated with nausea and a warm sensation Brought on by effort related activities. Resolves with resting.  Last episode was earlier this morning while he was walking in a local store. For reason he is not seek medical attention by going to the closest ER via EMS despite ongoing discomfort of the last couple weeks. Patient states that at times he feels " like I am  not going to make it."   Review of Systems: .   Review of Systems  Cardiovascular:  Positive for chest pain and dyspnea on exertion. Negative for claudication, irregular heartbeat, leg swelling, near-syncope, orthopnea, palpitations, paroxysmal nocturnal dyspnea and syncope.  Respiratory:  Positive for shortness of breath.   Hematologic/Lymphatic: Negative for bleeding problem.  Musculoskeletal:  Negative for muscle cramps and myalgias.  Neurological:  Positive for light-headedness. Negative for dizziness.    Studies Reviewed:   EKG: EKG Interpretation Date/Time:  Wednesday May 29 2023 16:10:01 EST Text Interpretation: Atrial fibrillation with slow ventricular response When compared with ECG of 01-Apr-2023 08:29, No significant change since last tracing Confirmed by Tessa Lerner [phone]) on 05/29/2023 4:15:16 PM  Echocardiogram: 08/07/2021:  Normal LV systolic function with visual EF 55-60%. Left ventricle cavity is normal in size. Mild left ventricular hypertrophy. Normal global wall motion. Normal LAP. Unable to evaluate diastolic function due to atrial fibrillation.  Native trileaflet aortic valve.   Trace aortic regurgitation. Aortic valve sclerosis without stenosis.  Mild tricuspid regurgitation. No evidence of pulmonary hypertension.  Mild pulmonic regurgitation.  Compared to study 01/07/2020 no significant change.  Lexiscan (with Mod Bruce protocol) Nuclear stress test 02/13/2022: Myocardial perfusion is abnormal. There is a small sized reversible mild defect in the septal and apical regions.  Overall LV systolic function is normal without regional wall motion abnormalities. Stress LV EF: 57%.  Nondiagnostic ECG stress. The heart rate response was consistent with Regadenoson.  No previous exam available for comparison. Low risk.    CT CCS 02/21/2022: FINDINGS: CORONARY CALCIUM SCORES: Left Main: 131 LAD: 186 LCx: 23 RCA:  107   Total Agatston Score: 447 MESA database  percentile: 73   Heart Catheterization: 03/13/2022: LM: Normal LAD: Prox 50% stenosis. RFR 0.94 (Physiologically non-significant) Lcx: Large, normal Ramus; Normal RCA: Mild luminal irregularities.   LVEDP normal   Continue medical management for CAD Reasonable to omit Aspirin given ongoing anticoagulation for Afib and absence of severe disease Given 50% LAD disease, probably avoid flecainide as AAD.   Direct-current cardioversion: 05/24/2020: 200 J of synchronized x1 converted to normal sinus rhythm.  RADIOLOGY: N/A  Risk Assessment/Calculations:   Click Here to Calculate/Change CHADS2VASc Score The patient's CHADS2-VASc score is 3, indicating a 3.2% annual risk of stroke.   CHF History: No HTN History: Yes Diabetes History: Yes Stroke History: No Vascular Disease History: No  Labs:    External Labs: Collected: April 2024: Total cholesterol 128, triglycerides 108, HDL 48, LDL 61, non-HDL 80  Collected: September 2024 Care Everywhere Potassium 3.7. eGFR 73. BUN 18, creatinine 1.09 Hemoglobin A1c 6.5  Physical Exam:    Today's Vitals   05/29/23 1606  BP: (!) 152/92  Pulse: 74  Resp: 16  SpO2: 98%  Weight: 265 lb (120.2 kg)  Height: 6' (1.829 m)   Body mass index is 35.94 kg/m. Wt Readings from Last 3 Encounters:  05/29/23 265 lb (120.2 kg)  04/01/23 265 lb 6.4 oz (120.4 kg)  10/31/22 261 lb 3.2 oz (118.5 kg)    Physical Exam  Constitutional: No distress.  Age appropriate, hemodynamically stable.   Neck: No JVD present.  Cardiovascular: S1 normal, S2 normal, intact distal pulses and normal pulses. An irregularly irregular rhythm present. Bradycardia present. Exam reveals no gallop, no S3 and no S4.  Murmur heard. Holosystolic murmur is present with a grade of 3/6 at the apex radiating to the axilla. Varicose veins bilaterally.  Pulmonary/Chest: Effort normal and breath sounds normal. No stridor. He has no wheezes. He has no rales.  Abdominal: Soft.  Bowel sounds are normal. He exhibits no distension. There is no abdominal tenderness.  Musculoskeletal:        General: No edema.     Cervical back: Neck supple.  Neurological: He is alert and oriented to person, place, and time. He has intact cranial nerves (2-12).  Skin: Skin is warm and moist.    Impression & Recommendation(s):  Impression:   ICD-10-CM   1. Unstable angina (HCC)  I20.0 EKG 12-Lead    2. Persistent atrial fibrillation (HCC)  I48.19     3. Long term (current) use of anticoagulants  Z79.01     4. Benign hypertension  I10     5. Mixed hyperlipidemia  E78.2     6. Non-insulin dependent type 2 diabetes mellitus (HCC)  E11.9     7. Class 2 severe obesity due to excess calories with serious comorbidity and body mass index (BMI) of 35.0 to 35.9 in adult [facility])  E33.295    E66.01    Z68.35        Recommendation(s):  Unstable angina Patient symptoms are concerning for cardiac discomfort consistent with unstable angina. Last precordial discomfort earlier this morning. EKG today illustrates slow A-fib with controlled ventricular rate.  No obvious signs of myocardial injury pattern. Pretest probability of having CAD is high given his risk factors and therefore not an ideal candidate for stress test.  Stress test back in 2023 was reported to be low risk.  Given his A-fib coronary CTA is also less than ideal test of choice in the clinical setting His  heart catheterization in 2023 noted 50% disease in the LAD which was recommended to be treated medically at that time. Start aspirin 81 mg p.o. daily until the workup is complete Continue statin therapy Nitroglycerin tablets to use on as needed basis Educated him and his wife on seeking medical attention sooner by going to the closest ER via EMS if the symptoms increase in intensity, frequency, duration, or has typical chest pain as discussed in the office.  Patient verbalized understanding. Avoid overexertion at this time  until workup is complete  Persistent atrial fibrillation (HCC) Long term (current) use of anticoagulants Likely permanent. Rate control: N/A, Toprol-XL discontinued in the past due to bradycardia Rhythm control: N/A Thromboembolic prophylaxis: Xarelto Patient does not endorse any evidence of bleeding.  Benign hypertension Office blood pressures are not at goal.  Home blood pressures are better controlled around 130/70 mmHg per patient. Continue hydrochlorothiazide 25 mg p.o. daily. Continue isosorbide dinitrate 30 mg p.o. 3 times daily. Continue losartan 40 mg p.o. daily  Mixed hyperlipidemia Currently on Crestor 40 mg p.o. daily.   He denies myalgia or other side effects. Most recent lipids dated April 2024, independently reviewed as noted above.  LDL is 61 mg/dL.  Cardiology is following peripherally.  Final Medication List:    No orders of the defined types were placed in this encounter.   There are no discontinued medications.    Current Outpatient Medications:    allopurinol (ZYLOPRIM) 300 MG tablet, Take 300 mg by mouth daily., Disp: , Rfl: 0   B Complex Vitamins (VITAMIN B COMPLEX) TABS, Take 1 tablet by mouth daily., Disp: , Rfl:    hydrochlorothiazide (HYDRODIURIL) 25 MG tablet, Take 25 mg by mouth daily., Disp: , Rfl:    ibuprofen (ADVIL) 200 MG tablet, Take 400 mg by mouth every 6 (six) hours as needed for moderate pain or headache., Disp: , Rfl:    isosorbide dinitrate (ISORDIL) 30 MG tablet, Take 1 tablet (30 mg total) by mouth 3 (three) times daily., Disp: 180 tablet, Rfl: 3   olmesartan (BENICAR) 40 MG tablet, Take 40 mg by mouth daily., Disp: , Rfl:    Oxcarbazepine (TRILEPTAL) 300 MG tablet, Take 300 mg by mouth 2 (two) times daily., Disp: , Rfl:    rosuvastatin (CRESTOR) 40 MG tablet, TAKE 1 TABLET(40 MG) BY MOUTH DAILY, Disp: 90 tablet, Rfl: 1   zinc gluconate 50 MG tablet, Take 50 mg by mouth daily., Disp: , Rfl:    rivaroxaban (XARELTO) 20 MG TABS tablet,  Take 20 mg by mouth daily with supper., Disp: , Rfl:   Consent:   Informed Consent   Shared Decision Making/Informed Consent The risks [stroke (1 in 1000), death (1 in 1000), kidney failure [usually temporary] (1 in 500), bleeding (1 in 200), allergic reaction [possibly serious] (1 in 200)], benefits (diagnostic support and management of coronary artery disease) and alternatives of a cardiac catheterization were discussed in detail with Mr. Capley and he is willing to proceed.    Disposition:   2 weeks post heart catheterization with APP.  His questions and concerns were addressed to his satisfaction. He voices understanding of the recommendations provided during this encounter.   Patient was seen today as an acute visit for chest pain evaluation.  His symptoms are concerning for unstable angina.  For reasons unknown he has not gone to ED for a more expedited evaluation.  Medications prescribed, labs ordered, and coordinating care with Cath Lab to see if his heart catheterization with  possible intervention can be performed tomorrow or later this week.  Patient also understands that if symptoms were to get worse in intensity frequency or duration to go to the closest ER via EMS.   Signed, Tessa Lerner, DO, [facility]  [facility]  [address] [place], [place] [address] 05/29/2023 4:40 PM

## 2023-05-29 NOTE — Patient Instructions (Addendum)
Medication Instructions:  Your physician has recommended you make the following change in your medication:   START Aspirin 81 mg once daily   AS NEEDED Nitroglycerin 0.4 mg SL Take 1 tablet for chest pain under the tongue. Wait 5 minutes. If the chest pain continues, take another tablet and call 911. Take NO MORE than 3 tablets for 1 episode of chest pain.    *If you need a refill on your cardiac medications before your next appointment, please call your pharmacy*  Lab Work: Completed today: CBC and BMP  If you have labs (blood work) drawn today and your tests are completely normal, you will receive your results only by: MyChart Message (if you have MyChart) OR A paper copy in the mail If you have any lab test that is abnormal or we need to change your treatment, we will call you to review the results.  Testing/Procedures: Left heart catherization scheduled for 06/03/23 with Dr. Jacinto Halim. Be at the hospital by 5:30 AM.  Follow-Up: At [facility], you and your health needs are our priority.  As part of our continuing mission to provide you with exceptional heart care, we have created designated Provider Care Teams.  These Care Teams include your primary Cardiologist (physician) and Advanced Practice Providers (APPs -  Physician Assistants and Nurse Practitioners) who all work together to provide you with the care you need, when you need it.  We recommend signing up for the patient portal called "MyChart".  Sign up information is provided on this After Visit Summary.  MyChart is used to connect with patients for Virtual Visits (Telemedicine).  Patients are able to view lab/test results, encounter notes, upcoming appointments, etc.  Non-urgent messages can be sent to your provider as well.   To learn more about what you can do with MyChart, go to [email or web address].    Your next appointment:   2 week(s) with APP  The format for your next appointment:   In Person  Provider:   Jari Favre, PA-C, Ronie Spies, PA-C, Robin Searing, NP, Jacolyn Reedy, PA-C, Eligha Bridegroom, NP, Tereso Newcomer, PA-C, or Perlie Gold, PA-C. Then, [employer], DO will plan to see you again in 6 month(s).  Other Instructions       Cardiac/Peripheral Catheterization   You are scheduled for a Cardiac Catheterization on Monday, January 27 with Dr.  Jacinto Halim .  1. Please arrive at the [facility] (Main Entrance A) at [facility]: [address] [place], [place] [address] at 5:30 AM (This time is 2 hour(s) before your procedure to ensure your preparation). Your procedure is scheduled to begin at 7:30 AM.  Free valet parking service is available. You will check in at ADMITTING. The support person will be asked to wait in the waiting room.  It is OK to have someone drop you off and come back when you are ready to be discharged.        Special note: Every effort is made to have your procedure done on time. Please understand that emergencies sometimes delay scheduled procedures.  2. Diet: Do not eat solid foods after midnight.  You may have clear liquids until 5 AM the day of the procedure.  3. Labs: You completed the required lab work at your appointment with Dr. Odis Hollingshead on 05/29/23.  4. Medication instructions in preparation for your procedure:   Contrast Allergy: No  Stop taking Xarelto (Rivaroxaban) on Sunday, January 26.  Stop taking, HTCZ (Hydrochlorothiazide) Monday, January 27,  On  the morning of your procedure, take Aspirin 81 mg and any morning medicines NOT listed above.  You may use sips of water.  5. Plan to go home the same day, you will only stay overnight if medically necessary. 6. You MUST have a responsible adult to drive you home. 7. An adult MUST be with you the first 24 hours after you arrive home. 8. Bring a current list of your medications, and the last time and date medication taken. 9. Bring ID and current insurance cards. 10.Please wear clothes that are easy to get  on and off and wear slip-on shoes.  Thank you for allowing [place] to care for you!   -- [facility]

## 2023-05-29 NOTE — H&P (View-Only) (Signed)
Cardiology Office Note:  .   Date:  05/29/2023  ID:  Jerry Hudson, DOB Sep 17, 1950, MRN [id number] PCP:  Richmond Campbell PA-C  Former Cardiology Providers: N/A [facility] Providers Cardiologist:  Tessa Lerner, DO , [facility] (established care August 2021) Electrophysiologist:  None  Click to update primary MD,subspecialty MD or APP then REFRESH:1}    Chief Complaint  Patient presents with   Chest Pain    History of Present Illness: .   Jerry Hudson is a 72 y.o.  male whose past medical history and cardiovascular risk factors includes: Persistent atrial fibrillation, hypertension, history of prostate cancer status post robotic assisted laparoscopic prostatectomy with bilateral pelvic lymph node dissection repair, trigeminal neuralgia status post nerve decompression, history of varicose veins, obesity due to excess calories, advanced age.   Patient being followed by the practice for atrial fibrillation management.  Diagnosed in July 2021 when he was undergoing routine colonoscopy.  And since then he has been on rate control strategy as well as oral anticoagulation for thromboembolic prophylaxis.  He did undergo cardioversion x 1 but had early recurrence of A-fib and since then he is been managed medically.  He was seen in November 2024 as regular follow-up for his persistent atrial fibrillation but now presents today for sick visit for evaluation of chest pain.  Patient is accompanied by his wife at today's office visit.  Chest pain: Ongoing for the last couple weeks. Substernally located, pressure-like sensation, associated with nausea and a warm sensation Brought on by effort related activities. Resolves with resting.  Last episode was earlier this morning while he was walking in a local store. For reason he is not seek medical attention by going to the closest ER via EMS despite ongoing discomfort of the last couple weeks. Patient states that at times he feels " like I am  not going to make it."   Review of Systems: .   Review of Systems  Cardiovascular:  Positive for chest pain and dyspnea on exertion. Negative for claudication, irregular heartbeat, leg swelling, near-syncope, orthopnea, palpitations, paroxysmal nocturnal dyspnea and syncope.  Respiratory:  Positive for shortness of breath.   Hematologic/Lymphatic: Negative for bleeding problem.  Musculoskeletal:  Negative for muscle cramps and myalgias.  Neurological:  Positive for light-headedness. Negative for dizziness.    Studies Reviewed:   EKG: EKG Interpretation Date/Time:  Wednesday May 29 2023 16:10:01 EST Text Interpretation: Atrial fibrillation with slow ventricular response When compared with ECG of 01-Apr-2023 08:29, No significant change since last tracing Confirmed by Tessa Lerner [id number]) on 05/29/2023 4:15:16 PM  Echocardiogram: 08/07/2021:  Normal LV systolic function with visual EF 55-60%. Left ventricle cavity is normal in size. Mild left ventricular hypertrophy. Normal global wall motion. Normal LAP. Unable to evaluate diastolic function due to atrial fibrillation.  Native trileaflet aortic valve.   Trace aortic regurgitation. Aortic valve sclerosis without stenosis.  Mild tricuspid regurgitation. No evidence of pulmonary hypertension.  Mild pulmonic regurgitation.  Compared to study 01/07/2020 no significant change.  Lexiscan (with Mod Bruce protocol) Nuclear stress test 02/13/2022: Myocardial perfusion is abnormal. There is a small sized reversible mild defect in the septal and apical regions.  Overall LV systolic function is normal without regional wall motion abnormalities. Stress LV EF: 57%.  Nondiagnostic ECG stress. The heart rate response was consistent with Regadenoson.  No previous exam available for comparison. Low risk.    CT CCS 02/21/2022: FINDINGS: CORONARY CALCIUM SCORES: Left Main: 131 LAD: 186 LCx: 23 RCA:  107   Total Agatston Score: 447 MESA database  percentile: 73   Heart Catheterization: 03/13/2022: LM: Normal LAD: Prox 50% stenosis. RFR 0.94 (Physiologically non-significant) Lcx: Large, normal Ramus; Normal RCA: Mild luminal irregularities.   LVEDP normal   Continue medical management for CAD Reasonable to omit Aspirin given ongoing anticoagulation for Afib and absence of severe disease Given 50% LAD disease, probably avoid flecainide as AAD.   Direct-current cardioversion: 05/24/2020: 200 J of synchronized x1 converted to normal sinus rhythm.  RADIOLOGY: N/A  Risk Assessment/Calculations:   Click Here to Calculate/Change CHADS2VASc Score The patient's CHADS2-VASc score is 3, indicating a 3.2% annual risk of stroke.   CHF History: No HTN History: Yes Diabetes History: Yes Stroke History: No Vascular Disease History: No  Labs:    External Labs: Collected: April 2024: Total cholesterol 128, triglycerides 108, HDL 48, LDL 61, non-HDL 80  Collected: September 2024 Care Everywhere Potassium 3.7. eGFR 73. BUN 18, creatinine 1.09 Hemoglobin A1c 6.5  Physical Exam:    Today's Vitals   05/29/23 1606  BP: (!) 152/92  Pulse: 74  Resp: 16  SpO2: 98%  Weight: 265 lb (120.2 kg)  Height: 6' (1.829 m)   Body mass index is 35.94 kg/m. Wt Readings from Last 3 Encounters:  05/29/23 265 lb (120.2 kg)  04/01/23 265 lb 6.4 oz (120.4 kg)  10/31/22 261 lb 3.2 oz (118.5 kg)    Physical Exam  Constitutional: No distress.  Age appropriate, hemodynamically stable.   Neck: No JVD present.  Cardiovascular: S1 normal, S2 normal, intact distal pulses and normal pulses. An irregularly irregular rhythm present. Bradycardia present. Exam reveals no gallop, no S3 and no S4.  Murmur heard. Holosystolic murmur is present with a grade of 3/6 at the apex radiating to the axilla. Varicose veins bilaterally.  Pulmonary/Chest: Effort normal and breath sounds normal. No stridor. He has no wheezes. He has no rales.  Abdominal: Soft.  Bowel sounds are normal. He exhibits no distension. There is no abdominal tenderness.  Musculoskeletal:        General: No edema.     Cervical back: Neck supple.  Neurological: He is alert and oriented to person, place, and time. He has intact cranial nerves (2-12).  Skin: Skin is warm and moist.    Impression & Recommendation(s):  Impression:   ICD-10-CM   1. Unstable angina (HCC)  I20.0 EKG 12-Lead    2. Persistent atrial fibrillation (HCC)  I48.19     3. Long term (current) use of anticoagulants  Z79.01     4. Benign hypertension  I10     5. Mixed hyperlipidemia  E78.2     6. Non-insulin dependent type 2 diabetes mellitus (HCC)  E11.9     7. Class 2 severe obesity due to excess calories with serious comorbidity and body mass index (BMI) of 35.0 to 35.9 in adult [facility])  Z61.096    E66.01    Z68.35        Recommendation(s):  Unstable angina Patient symptoms are concerning for cardiac discomfort consistent with unstable angina. Last precordial discomfort earlier this morning. EKG today illustrates slow A-fib with controlled ventricular rate.  No obvious signs of myocardial injury pattern. Pretest probability of having CAD is high given his risk factors and therefore not an ideal candidate for stress test.  Stress test back in 2023 was reported to be low risk.  Given his A-fib coronary CTA is also less than ideal test of choice in the clinical setting His  heart catheterization in 2023 noted 50% disease in the LAD which was recommended to be treated medically at that time. Start aspirin 81 mg p.o. daily until the workup is complete Continue statin therapy Nitroglycerin tablets to use on as needed basis Educated him and his wife on seeking medical attention sooner by going to the closest ER via EMS if the symptoms increase in intensity, frequency, duration, or has typical chest pain as discussed in the office.  Patient verbalized understanding. Avoid overexertion at this time  until workup is complete  Persistent atrial fibrillation (HCC) Long term (current) use of anticoagulants Likely permanent. Rate control: N/A, Toprol-XL discontinued in the past due to bradycardia Rhythm control: N/A Thromboembolic prophylaxis: Xarelto Patient does not endorse any evidence of bleeding.  Benign hypertension Office blood pressures are not at goal.  Home blood pressures are better controlled around 130/70 mmHg per patient. Continue hydrochlorothiazide 25 mg p.o. daily. Continue isosorbide dinitrate 30 mg p.o. 3 times daily. Continue losartan 40 mg p.o. daily  Mixed hyperlipidemia Currently on Crestor 40 mg p.o. daily.   He denies myalgia or other side effects. Most recent lipids dated April 2024, independently reviewed as noted above.  LDL is 61 mg/dL.  Cardiology is following peripherally.  Final Medication List:    No orders of the defined types were placed in this encounter.   There are no discontinued medications.    Current Outpatient Medications:    allopurinol (ZYLOPRIM) 300 MG tablet, Take 300 mg by mouth daily., Disp: , Rfl: 0   B Complex Vitamins (VITAMIN B COMPLEX) TABS, Take 1 tablet by mouth daily., Disp: , Rfl:    hydrochlorothiazide (HYDRODIURIL) 25 MG tablet, Take 25 mg by mouth daily., Disp: , Rfl:    ibuprofen (ADVIL) 200 MG tablet, Take 400 mg by mouth every 6 (six) hours as needed for moderate pain or headache., Disp: , Rfl:    isosorbide dinitrate (ISORDIL) 30 MG tablet, Take 1 tablet (30 mg total) by mouth 3 (three) times daily., Disp: 180 tablet, Rfl: 3   olmesartan (BENICAR) 40 MG tablet, Take 40 mg by mouth daily., Disp: , Rfl:    Oxcarbazepine (TRILEPTAL) 300 MG tablet, Take 300 mg by mouth 2 (two) times daily., Disp: , Rfl:    rosuvastatin (CRESTOR) 40 MG tablet, TAKE 1 TABLET(40 MG) BY MOUTH DAILY, Disp: 90 tablet, Rfl: 1   zinc gluconate 50 MG tablet, Take 50 mg by mouth daily., Disp: , Rfl:    rivaroxaban (XARELTO) 20 MG TABS tablet,  Take 20 mg by mouth daily with supper., Disp: , Rfl:   Consent:   Informed Consent   Shared Decision Making/Informed Consent The risks [stroke (1 in 1000), death (1 in 1000), kidney failure [usually temporary] (1 in 500), bleeding (1 in 200), allergic reaction [possibly serious] (1 in 200)], benefits (diagnostic support and management of coronary artery disease) and alternatives of a cardiac catheterization were discussed in detail with Jerry Hudson and he is willing to proceed.    Disposition:   2 weeks post heart catheterization with APP.  His questions and concerns were addressed to his satisfaction. He voices understanding of the recommendations provided during this encounter.   Patient was seen today as an acute visit for chest pain evaluation.  His symptoms are concerning for unstable angina.  For reasons unknown he has not gone to ED for a more expedited evaluation.  Medications prescribed, labs ordered, and coordinating care with Cath Lab to see if his heart catheterization with  possible intervention can be performed tomorrow or later this week.  Patient also understands that if symptoms were to get worse in intensity frequency or duration to go to the closest ER via EMS.   Signed, Tessa Lerner, DO, [facility]  [facility]  [address] [place], [place] [address] 05/29/2023 4:40 PM

## 2023-05-29 NOTE — Telephone Encounter (Signed)
Pt c/o of Chest Pain: STAT if CP now or developed within 24 hours  1. Are you having CP right now? No   2. Are you experiencing any other symptoms (ex. SOB, nausea, vomiting, sweating)? Nausea and lightheaded   3. How long have you been experiencing CP? 2 weeks  4. Is your CP continuous or coming and going? Coming and going   5. Have you taken Nitroglycerin? No  ?

## 2023-05-30 ENCOUNTER — Telehealth: Payer: Self-pay | Admitting: *Deleted

## 2023-05-30 LAB — BASIC METABOLIC PANEL
BUN/Creatinine Ratio: 21 (ref 10–24)
BUN: 20 mg/dL (ref 8–27)
CO2: 25 mmol/L (ref 20–29)
Calcium: 9.7 mg/dL (ref 8.6–10.2)
Chloride: 103 mmol/L (ref 96–106)
Creatinine, Ser: 0.97 mg/dL (ref 0.76–1.27)
Glucose: 113 mg/dL — ABNORMAL HIGH (ref 70–99)
Potassium: 4 mmol/L (ref 3.5–5.2)
Sodium: 143 mmol/L (ref 134–144)
eGFR: 84 mL/min/{1.73_m2} (ref >59)

## 2023-05-30 LAB — CBC
Hematocrit: 45.2 % (ref 37.5–51.0)
Hemoglobin: 14.9 g/dL (ref 13.0–17.7)
MCH: 31.2 pg (ref 26.6–33.0)
MCHC: 33 g/dL (ref 31.5–35.7)
MCV: 95 fL (ref 79–97)
Platelets: 202 10*3/uL (ref 150–450)
RBC: 4.77 x10E6/uL (ref 4.14–5.80)
RDW: 13.5 % (ref 11.6–15.4)
WBC: 7.2 10*3/uL (ref 3.4–10.8)

## 2023-05-30 NOTE — Telephone Encounter (Signed)
Please see office note from 05/29/2023.  Soraya Paquette Watchtower, DO, [facility]

## 2023-05-30 NOTE — Telephone Encounter (Addendum)
Cardiac Catheterization scheduled at [facility] for: Monday June 03, 2023 7:30 AM Arrival time [facility] Main Entrance A at: 5:30 AM  Nothing to eat after midnight prior to procedure, clear liquids until 5 AM day of procedure.  Medication instructions: -Hold:  Xarelto-none 06/02/23 until post procedure -per instructions-per Dr Jacinto Halim   Hydrochlorothiazide-AM of procedure -Other usual morning medications can be taken with sips of water including aspirin 81 mg.  Plan to go home the same day, you will only stay overnight if medically necessary.  You must have responsible adult to drive you home.  Someone must be with you the first 24 hours after you arrive home.  Reviewed procedure instructions with patient.

## 2023-06-03 ENCOUNTER — Ambulatory Visit (HOSPITAL_COMMUNITY)
Admission: RE | Disposition: A | Discharge status: Home / Self Care | Payer: Self-pay | Source: Home / Self Care | Attending: Cardiology

## 2023-06-03 ENCOUNTER — Ambulatory Visit (HOSPITAL_COMMUNITY)
Admission: RE | Admit: 2023-06-03 | Discharge: 2023-06-03 | Disposition: A | Discharge status: Home / Self Care | Payer: Medicare Other | Attending: Cardiology | Admitting: Cardiology

## 2023-06-03 ENCOUNTER — Other Ambulatory Visit: Payer: Self-pay

## 2023-06-03 ENCOUNTER — Encounter (HOSPITAL_COMMUNITY): Payer: Self-pay | Admitting: Cardiology

## 2023-06-03 DIAGNOSIS — Z79899 Other long term (current) drug therapy: Secondary | ICD-10-CM | POA: Insufficient documentation

## 2023-06-03 DIAGNOSIS — Z7901 Long term (current) use of anticoagulants: Secondary | ICD-10-CM | POA: Insufficient documentation

## 2023-06-03 DIAGNOSIS — I25110 Atherosclerotic heart disease of native coronary artery with unstable angina pectoris: Secondary | ICD-10-CM | POA: Insufficient documentation

## 2023-06-03 DIAGNOSIS — E66812 Obesity, class 2: Secondary | ICD-10-CM | POA: Insufficient documentation

## 2023-06-03 DIAGNOSIS — Z7984 Long term (current) use of oral hypoglycemic drugs: Secondary | ICD-10-CM | POA: Diagnosis not present

## 2023-06-03 DIAGNOSIS — I4819 Other persistent atrial fibrillation: Secondary | ICD-10-CM | POA: Insufficient documentation

## 2023-06-03 DIAGNOSIS — E119 Type 2 diabetes mellitus without complications: Secondary | ICD-10-CM | POA: Diagnosis not present

## 2023-06-03 DIAGNOSIS — Z6835 Body mass index (BMI) 35.0-35.9, adult: Secondary | ICD-10-CM | POA: Insufficient documentation

## 2023-06-03 DIAGNOSIS — I2510 Atherosclerotic heart disease of native coronary artery without angina pectoris: Secondary | ICD-10-CM

## 2023-06-03 DIAGNOSIS — Z8546 Personal history of malignant neoplasm of prostate: Secondary | ICD-10-CM | POA: Diagnosis not present

## 2023-06-03 DIAGNOSIS — I10 Essential (primary) hypertension: Secondary | ICD-10-CM | POA: Insufficient documentation

## 2023-06-03 DIAGNOSIS — I2584 Coronary atherosclerosis due to calcified coronary lesion: Secondary | ICD-10-CM | POA: Diagnosis not present

## 2023-06-03 DIAGNOSIS — E782 Mixed hyperlipidemia: Secondary | ICD-10-CM | POA: Insufficient documentation

## 2023-06-03 HISTORY — PX: LEFT HEART CATH AND CORONARY ANGIOGRAPHY: CATH118249

## 2023-06-03 LAB — GLUCOSE, CAPILLARY: Glucose-Capillary: 142 mg/dL — ABNORMAL HIGH (ref 70–99)

## 2023-06-03 SURGERY — LEFT HEART CATH AND CORONARY ANGIOGRAPHY
Anesthesia: LOCAL

## 2023-06-03 MED ORDER — SODIUM CHLORIDE 0.9 % IV SOLN: 250.0000 mL | INTRAVENOUS | Status: DC | PRN

## 2023-06-03 MED ORDER — VERAPAMIL HCL 2.5 MG/ML IV SOLN
INTRAVENOUS | Status: AC
  Filled 2023-06-03: qty 2

## 2023-06-03 MED ORDER — ASPIRIN 81 MG PO CHEW: 81.0000 mg | CHEWABLE_TABLET | ORAL | Status: AC

## 2023-06-03 MED ORDER — LIDOCAINE HCL (PF) 1 % IJ SOLN
INTRAMUSCULAR | Status: DC | PRN
  Administered 2023-06-03 (×2): 5 mL

## 2023-06-03 MED ORDER — IOHEXOL 350 MG/ML SOLN
INTRAVENOUS | Status: DC | PRN
  Administered 2023-06-03: 50 mL

## 2023-06-03 MED ORDER — NITROGLYCERIN 1 MG/10 ML FOR IR/CATH LAB
INTRA_ARTERIAL | Status: DC | PRN
  Administered 2023-06-03 (×2): 200 ug via INTRACORONARY

## 2023-06-03 MED ORDER — MIDAZOLAM HCL 2 MG/2ML IJ SOLN
INTRAMUSCULAR | Status: AC
Start: 2023-06-03 — End: ?
  Filled 2023-06-03: qty 2

## 2023-06-03 MED ORDER — HEPARIN SODIUM (PORCINE) 1000 UNIT/ML IJ SOLN
INTRAMUSCULAR | Status: AC
  Filled 2023-06-03: qty 10

## 2023-06-03 MED ORDER — ONDANSETRON HCL 4 MG/2ML IJ SOLN: 4.0000 mg | Freq: Four times a day (QID) | INTRAMUSCULAR | Status: DC | PRN

## 2023-06-03 MED ORDER — MIDAZOLAM HCL 2 MG/2ML IJ SOLN
INTRAMUSCULAR | Status: DC | PRN
  Administered 2023-06-03: 2 mg via INTRAVENOUS

## 2023-06-03 MED ORDER — FENTANYL CITRATE (PF) 100 MCG/2ML IJ SOLN
INTRAMUSCULAR | Status: AC
  Filled 2023-06-03: qty 2

## 2023-06-03 MED ORDER — NITROGLYCERIN 1 MG/10 ML FOR IR/CATH LAB
INTRA_ARTERIAL | Status: AC
  Filled 2023-06-03: qty 10

## 2023-06-03 MED ORDER — LIDOCAINE HCL (PF) 1 % IJ SOLN
INTRAMUSCULAR | Status: AC
  Filled 2023-06-03: qty 30

## 2023-06-03 MED ORDER — SODIUM CHLORIDE 0.9 % WEIGHT BASED INFUSION: 3.0000 mL/kg/h | INTRAVENOUS | Status: AC

## 2023-06-03 MED ORDER — AMLODIPINE BESYLATE 5 MG PO TABS: 5.0000 mg | ORAL_TABLET | Freq: Every day | ORAL | 0 refills | Status: DC

## 2023-06-03 MED ORDER — ACETAMINOPHEN 325 MG PO TABS
650.0000 mg | ORAL_TABLET | ORAL | Status: DC | PRN
  Administered 2023-06-03: 650 mg via ORAL
  Filled 2023-06-03: qty 2

## 2023-06-03 MED ORDER — SODIUM CHLORIDE 0.9 % WEIGHT BASED INFUSION: 1.0000 mL/kg/h | INTRAVENOUS | Status: DC

## 2023-06-03 MED ORDER — SODIUM CHLORIDE 0.9% FLUSH: 3.0000 mL | INTRAVENOUS | Status: DC | PRN

## 2023-06-03 MED ORDER — HEPARIN (PORCINE) IN NACL 1000-0.9 UT/500ML-% IV SOLN
INTRAVENOUS | Status: DC | PRN
  Administered 2023-06-03 (×2): 500 mL

## 2023-06-03 MED ORDER — FENTANYL CITRATE (PF) 100 MCG/2ML IJ SOLN
INTRAMUSCULAR | Status: DC | PRN
  Administered 2023-06-03: 25 ug via INTRAVENOUS

## 2023-06-03 SURGICAL SUPPLY — 13 items
CATH INFINITI 5 FR MPA2 (CATHETERS) IMPLANT
CATH INFINITI 5FR MULTPACK ANG (CATHETERS) IMPLANT
CLOSURE MYNX CONTROL 5F (Vascular Products) IMPLANT
GLIDESHEATH SLEND A-KIT 6F 22G (SHEATH) IMPLANT
GUIDEWIRE INQWIRE 1.5J.035X260 (WIRE) IMPLANT
INQWIRE 1.5J .035X260CM (WIRE) ×1
KIT SINGLE USE MANIFOLD (KITS) IMPLANT
KIT SYRINGE INJ CVI SPIKEX1 (MISCELLANEOUS) IMPLANT
PACK CARDIAC CATHETERIZATION (CUSTOM PROCEDURE TRAY) ×1 IMPLANT
SET ATX-X65L (MISCELLANEOUS) IMPLANT
SHEATH PINNACLE 5F 10CM (SHEATH) IMPLANT
SHEATH PROBE COVER 6X72 (BAG) IMPLANT
WIRE MICRO SET SILHO 5FR 7 (SHEATH) IMPLANT

## 2023-06-03 NOTE — Progress Notes (Signed)
Spoke with Dr. Jacinto Halim who stated patient could xarelto tonight.

## 2023-06-03 NOTE — Interval H&P Note (Signed)
History and Physical Interval Note:  06/03/2023 7:53 AM  Jerry Hudson  has presented today for surgery, with the diagnosis of unstable angina.  The various methods of treatment have been discussed with the patient and family. After consideration of risks, benefits and other options for treatment, the patient has consented to  Procedure(s): LEFT HEART CATH AND CORONARY ANGIOGRAPHY (N/A) and possible coronary angioplasty as a surgical intervention.  The patient's history has been reviewed, patient examined, no change in status, stable for surgery.  I have reviewed the patient's chart and labs.  Questions were answered to the patient's satisfaction.    Cath Lab Visit (complete for each Cath Lab visit)  Clinical Evaluation Leading to the Procedure:   ACS: Yes.    Non-ACS:    Anginal Classification: CCS IV  Anti-ischemic medical therapy: Minimal Therapy (1 class of medications)  Non-Invasive Test Results: No non-invasive testing performed  Prior CABG: No previous CABG  Jerry Hudson

## 2023-06-05 MED FILL — Verapamil HCl IV Soln 2.5 MG/ML: INTRAVENOUS | Qty: 2 | Status: AC

## 2023-06-17 NOTE — Progress Notes (Signed)
Cardiology Office Note    Patient Name: Jerry Hudson Date of Encounter: 06/17/2023  Primary Care Provider:  Richmond Campbell., PA-C Primary Cardiologist:  Tessa Lerner, DO Primary Electrophysiologist: None   Past Medical History    Past Medical History:  Diagnosis Date   Cancer [facility])    prostate cancer; pt reports he's "clean" as of his Nov 2019 check-up   Diabetes mellitus without complication (HCC)    Hyperlipidemia    Hypertension    Numbness and tingling    in feet bilat comes and goes    Ruptured lumbar disc    L5 ruptured, L4 deteriorated   Trauma    great right toe laceration secondary to lawnmower accident    Trigeminal neuralgia     History of Present Illness  Jerry Hudson is a 73 y.o. male with a PMH of persistent AF, HTN, prostate CA s/p prostatectomy, trigeminal neuralgia s/p nerve decompression, HLD, coronary calcifications, obesity, DM type II who presents today for 2-week follow-up post LHC.  Jerry Hudson was seen initially by Dr. Odis Hollingshead in 2021 by referral of PCP for newly diagnosed atrial fibrillation.  He underwent a colonoscopy on 11/2019 and was found to be in AF. He had already been started on Xarelto and was started on rate control medication.  He underwent nuclear stress test to rule out ischemia that was low risk.  He underwent TEE/DCCV on 05/2020 and converted to sinus rhythm however converted back to AF postprocedure.  He was seen in follow-up and discussed antiarrhythmic medication and EP evaluation however patient was not in favor of these interventions.  He was seen in follow-up 01/2022 and endorsed precordial pain and underwent CT calcium scoring that showed severe coronary calcifications and aortic atherosclerosis.  He underwent LHC due to elevated risk factors on 03/2022 that showed 50% stenosed LAD with no significant stenosis by RFR with normal left circumflex and mild luminal irregularities in RCA.  The recommendation was to pursue medical therapy at  that time.  He developed bradycardia and was taken off of Toprol-XL during visit on 03/2023.  He was last seen in office on 05/29/2023 by Dr.Tolia and endorsed chest pain.  Reported discomfort lasting that it was substernally located and associated with nausea and warm sensation.  He was sent for repeat LHC due to symptoms and increased risk factors that was completed on 06/03/2023.  LHC showed normal left main with moderate calcification in proximal to mid LAD and mild scattered disease and mid LAD.  There was no significant disease in the OM1 and OM 2 and microvascular angina was diagnosed and patient was started on amlodipine 5 mg.  Mr. Downum presents today for post heart cath follow-up.  He reports since his heart catheterization that he has not experienced any chest pain or angina. Since starting amlodipine, the patient reports no significant chest pain, only occasional twinges. The patient has not noticed any side effects from the medication and has been able to return to work.  His blood pressure today was controlled at 122/78 and heart rate was 68 bpm.  He has been compliant with his other medications and denies any adverse reactions.  During today's visit we discussed the pathophysiology of microvascular coronary disease and spasm.  The patient's right groin site was inspected and was within normal limits with no ecchymosis or hematoma present.  He was advised to contact office if feels any recurrence of his previous chest pain symptoms.   Patient denies chest pain, palpitations,  dyspnea, PND, orthopnea, nausea, vomiting, dizziness, syncope, edema, weight gain, or early satiety.  Review of Systems  Please see the history of present illness.    All other systems reviewed and are otherwise negative except as noted above.  Physical Exam    Wt Readings from Last 3 Encounters:  06/03/23 260 lb (117.9 kg)  05/29/23 265 lb (120.2 kg)  04/01/23 265 lb 6.4 oz (120.4 kg)   NW:GNFAO were no vitals filed  for this visit.,There is no height or weight on file to calculate BMI. GEN: Well nourished, well developed in no acute distress Neck: No JVD; No carotid bruits Pulmonary: Clear to auscultation without rales, wheezing or rhonchi  Cardiovascular: Irregularly irregular normal S1. Normal S2.   Murmurs: There is no murmur.  Right groin site clean dry with no evidence of ecchymosis or hematoma. ABDOMEN: Soft, non-tender, non-distended EXTREMITIES:  No edema; No deformity   EKG/LABS/ Recent Cardiac Studies   ECG personally reviewed by me today -none completed today  Risk Assessment/Calculations:    CHA2DS2-VASc Score = 3   This indicates a 3.2% annual risk of stroke. The patient's score is based upon: CHF History: 0 HTN History: 1 Diabetes History: 1 Stroke History: 0 Vascular Disease History: 0 Age Score: 1 Gender Score: 0         Lab Results  Component Value Date   WBC 7.2 05/29/2023   HGB 14.9 05/29/2023   HCT 45.2 05/29/2023   MCV 95 05/29/2023   PLT 202 05/29/2023   Lab Results  Component Value Date   CREATININE 0.97 05/29/2023   BUN 20 05/29/2023   NA 143 05/29/2023   K 4.0 05/29/2023   CL 103 05/29/2023   CO2 25 05/29/2023   No results found for: "CHOL", "HDL", "LDLCALC", "LDLDIRECT", "TRIG", "CHOLHDL"  No results found for: "HGBA1C" Assessment & Plan    1.  Coronary artery disease: -s/p LHC on 05/2023 showing 40% focal stenosis in mid LAD with no significant disease in OM1 and OM 2 and slow flow in RCA improved with IC nitroglycerin. -Today patient reports no chest pain or angina. -Continue current GDMT with Norvasc 5 mg daily, isosorbide dinitrate 30 mg 3 times daily, Crestor 40 mg daily -Patient was advised to contact office if chest discomfort returns or similar symptoms persist.  2.  Permanent AF: -Patient is currently rate controlled at 68 bpm -Continue Xarelto 20 mg daily  3.  Mixed hyperlipidemia: -Patient's last LDL cholesterol was 51 at  goal -Continue Crestor 40 mg daily  4.  DM type II: -Continue current treatment plan per PCP  5.  Primary HTN: -Blood pressure today was controlled at 122/78 -Continue Norvasc 5 mg daily, Isordil 30 mg 3 times daily, HCTZ 25 mg daily, olmesartan 40 mg daily   Disposition: Next scheduled appointment in June 2025. -Keep June appointment unless symptoms worse  Signed, Napoleon Form, Leodis Rains, NP 06/17/2023, 10:16 AM [facility]

## 2023-06-18 ENCOUNTER — Ambulatory Visit: Payer: Medicare Other | Attending: Nurse Practitioner | Admitting: Nurse Practitioner

## 2023-06-18 ENCOUNTER — Encounter: Payer: Self-pay | Admitting: Nurse Practitioner

## 2023-06-18 VITALS — BP 122/78 | HR 68 | Ht 72.0 in | Wt 263.2 lb

## 2023-06-18 DIAGNOSIS — E119 Type 2 diabetes mellitus without complications: Secondary | ICD-10-CM

## 2023-06-18 DIAGNOSIS — I10 Essential (primary) hypertension: Secondary | ICD-10-CM

## 2023-06-18 DIAGNOSIS — I2510 Atherosclerotic heart disease of native coronary artery without angina pectoris: Secondary | ICD-10-CM | POA: Diagnosis not present

## 2023-06-18 DIAGNOSIS — I2584 Coronary atherosclerosis due to calcified coronary lesion: Secondary | ICD-10-CM | POA: Diagnosis not present

## 2023-06-18 DIAGNOSIS — E782 Mixed hyperlipidemia: Secondary | ICD-10-CM

## 2023-06-18 DIAGNOSIS — I4819 Other persistent atrial fibrillation: Secondary | ICD-10-CM | POA: Diagnosis not present

## 2023-06-18 NOTE — Patient Instructions (Signed)
Medication Instructions:  Your physician recommends that you continue on your current medications as directed. Please refer to the Current Medication list given to you today.  *If you need a refill on your cardiac medications before your next appointment, please call your pharmacy*  Lab Work: If you have labs (blood work) drawn today and your tests are completely normal, you will receive your results only by: MyChart Message (if you have MyChart) OR A paper copy in the mail If you have any lab test that is abnormal or we need to change your treatment, we will call you to review the results.  Follow-Up: At [facility], you and your health needs are our priority.  As part of our continuing mission to provide you with exceptional heart care, we have created designated Provider Care Teams.  These Care Teams include your primary Cardiologist (physician) and Advanced Practice Providers (APPs -  Physician Assistants and Nurse Practitioners) who all work together to provide you with the care you need, when you need it.  We recommend signing up for the patient portal called "MyChart".  Sign up information is provided on this After Visit Summary.  MyChart is used to connect with patients for Virtual Visits (Telemedicine).  Patients are able to view lab/test results, encounter notes, upcoming appointments, etc.  Non-urgent messages can be sent to your provider as well.   To learn more about what you can do with MyChart, go to [email or web address].    Your next appointment:   Already scheduled  Provider:   Tessa Lerner, DO     Other Instructions   1st Floor: - Lobby - Registration  - Pharmacy  - Lab - Cafe  2nd Floor: - PV Lab - Diagnostic Testing (echo, CT, nuclear med)  3rd Floor: - Vacant  4th Floor: - TCTS (cardiothoracic surgery) - AFib Clinic - Structural Heart Clinic - Vascular Surgery  - Vascular Ultrasound  5th Floor: - HeartCare Cardiology (general and EP) -  Clinical Pharmacy for coumadin, hypertension, lipid, weight-loss medications, and med management appointments    Valet parking services will be available as well.

## 2023-07-01 ENCOUNTER — Telehealth: Payer: Self-pay | Admitting: Cardiology

## 2023-07-01 MED ORDER — AMLODIPINE BESYLATE 5 MG PO TABS: 5.0000 mg | ORAL_TABLET | Freq: Every day | ORAL | 3 refills | Status: DC

## 2023-07-01 NOTE — Telephone Encounter (Signed)
*  STAT* If patient is at the pharmacy, call can be transferred to refill team.   1. Which medications need to be refilled? (please list name of each medication and dose if known) amLODipine (NORVASC) 5 MG tablet    2. Would you like to learn more about the convenience, safety, & potential cost savings by using the [facility]?      3. Are you open to using the [facility] (Type [facility].  ).   4. Which pharmacy/location (including street and city if local pharmacy) is medication to be sent to?  [id number] - [place], [place] - [address] AT [facility] [address]    5. Do they need a 30 day or 90 day supply? 90 day  Patient is out of medicaiton

## 2023-07-01 NOTE — Telephone Encounter (Signed)
 Pt's medication was sent to pt's pharmacy as requested. Confirmation received.

## 2023-08-29 DIAGNOSIS — M25561 Pain in right knee: Secondary | ICD-10-CM | POA: Diagnosis not present

## 2023-09-02 DIAGNOSIS — I10 Essential (primary) hypertension: Secondary | ICD-10-CM | POA: Diagnosis not present

## 2023-09-02 DIAGNOSIS — R7301 Impaired fasting glucose: Secondary | ICD-10-CM | POA: Diagnosis not present

## 2023-09-02 DIAGNOSIS — Z0000 Encounter for general adult medical examination without abnormal findings: Secondary | ICD-10-CM | POA: Diagnosis not present

## 2023-09-02 DIAGNOSIS — E7800 Pure hypercholesterolemia, unspecified: Secondary | ICD-10-CM | POA: Diagnosis not present

## 2023-09-02 DIAGNOSIS — I4811 Longstanding persistent atrial fibrillation: Secondary | ICD-10-CM | POA: Diagnosis not present

## 2023-09-02 DIAGNOSIS — Z125 Encounter for screening for malignant neoplasm of prostate: Secondary | ICD-10-CM | POA: Diagnosis not present

## 2023-09-02 DIAGNOSIS — Z6838 Body mass index (BMI) 38.0-38.9, adult: Secondary | ICD-10-CM | POA: Diagnosis not present

## 2023-09-12 ENCOUNTER — Telehealth: Payer: Self-pay

## 2023-09-12 NOTE — Telephone Encounter (Signed)
   Pre-operative Risk Assessment    Patient Name: Jerry Hudson  DOB: 1950-06-01 MRN: [id number]   Date of last office visit: 06/18/2023 with Renelda Carry Date of next office visit: 10/16/2023 with Dr. Albert Huff   Request for Surgical Clearance    Procedure:  Right Total knee Arthroplasty  Date of Surgery:  Clearance TBD                                Surgeon:  Dr. Priscille Brought  Surgeon's Group or Practice Name:  [facility] Phone number:  [phone] ext: [phone] Attn: Loetta Ringer High   Fax number:  [phone]   Type of Clearance Requested:   - Medical  - Pharmacy:  Hold Rivaroxaban (Xarelto) not indicated   Type of Anesthesia:  Spinal   Additional requests/questions:    Kenny Peals   09/12/2023, 2:13 PM

## 2023-09-16 NOTE — Telephone Encounter (Signed)
 Please advise holding Xarelto prior to right total knee arthoplasty.   Thank you!  DW

## 2023-09-18 DIAGNOSIS — Z7901 Long term (current) use of anticoagulants: Secondary | ICD-10-CM | POA: Insufficient documentation

## 2023-09-18 DIAGNOSIS — E66811 Obesity, class 1: Secondary | ICD-10-CM | POA: Insufficient documentation

## 2023-09-18 DIAGNOSIS — K5904 Chronic idiopathic constipation: Secondary | ICD-10-CM | POA: Insufficient documentation

## 2023-09-18 NOTE — Telephone Encounter (Signed)
 Patient with diagnosis of A Fib on Xarelto for anticoagulation.    Procedure: Right Total knee Arthroplasty  Date of procedure: TBD   CHA2DS2-VASc Score = 3  This indicates a 3.2% annual risk of stroke. The patient's score is based upon: CHF History: 0 HTN History: 1 Diabetes History: 1 Stroke History: 0 Vascular Disease History: 0 Age Score: 1 Gender Score: 0   CrCl 93 ml/min using adj body weight Platelet count 194K  Per office protocol, patient can hold Xarelto for 3 days prior to procedure.     **This guidance is not considered finalized until pre-operative APP has relayed final recommendations.**

## 2023-09-18 NOTE — Telephone Encounter (Signed)
   Name: Jerry Hudson  DOB: 1951/04/06  MRN: [id number]  Primary Cardiologist: Olinda Bertrand, DO   Preoperative team, please contact this patient and set up a phone call appointment for further preoperative risk assessment. Please obtain consent and complete medication review. Thank you for your help.  *Please note, patient has office visit scheduled on 10/16/2023.  If he prefers to wait until his appointment with Dr. Albert Huff, will defer until a visit.  If he prefers to wait until scheduled appointment, please add preop clearance to office visit notes.  I confirm that guidance regarding antiplatelet and oral anticoagulation therapy has been completed and, if necessary, noted below.  Per office protocol, patient can hold Xarelto for 3 days prior to procedure.  Please resume Xarelto as soon as possible postprocedure, at the discretion of the surgeon.    I also confirmed the patient resides in the state of [place] . As per [facility] Medical Board telemedicine laws, the patient must reside in the state in which the provider is licensed.   Jude Norton, NP 09/18/2023, 8:50 AM [facility]

## 2023-09-18 NOTE — Telephone Encounter (Signed)
 Patient was returning call. Please advise ?

## 2023-09-18 NOTE — Telephone Encounter (Signed)
 Left message for pt to call back to the preop team to discuss options for preop clearance appt.

## 2023-09-19 NOTE — Telephone Encounter (Signed)
 Called and spoke to patient he stated he was on the road right now and wasn't able to do the phone call that exact moment I explain to patient our phone call appointment process and told him I had an opening for tomorrow patient said he will be working and it is difficult to talk while working reminded him of his upcoming appointment patient stated he did not mind waiting till then to be cleared I will include preop clearance on patient's appointment note and will make requesting office aware patient is willing to wait till his next appointment which is scheduled on 10/16/23

## 2023-10-16 ENCOUNTER — Ambulatory Visit: Payer: Medicare Other | Attending: Cardiology | Admitting: Cardiology

## 2023-10-16 ENCOUNTER — Encounter: Payer: Self-pay | Admitting: Cardiology

## 2023-10-16 VITALS — BP 116/76 | HR 85 | Resp 16 | Ht 72.0 in | Wt 264.2 lb

## 2023-10-16 DIAGNOSIS — R931 Abnormal findings on diagnostic imaging of heart and coronary circulation: Secondary | ICD-10-CM | POA: Diagnosis not present

## 2023-10-16 DIAGNOSIS — E782 Mixed hyperlipidemia: Secondary | ICD-10-CM

## 2023-10-16 DIAGNOSIS — Z6835 Body mass index (BMI) 35.0-35.9, adult: Secondary | ICD-10-CM

## 2023-10-16 DIAGNOSIS — I4819 Other persistent atrial fibrillation: Secondary | ICD-10-CM | POA: Diagnosis not present

## 2023-10-16 DIAGNOSIS — Z7901 Long term (current) use of anticoagulants: Secondary | ICD-10-CM

## 2023-10-16 DIAGNOSIS — I2584 Coronary atherosclerosis due to calcified coronary lesion: Secondary | ICD-10-CM | POA: Diagnosis not present

## 2023-10-16 DIAGNOSIS — I2510 Atherosclerotic heart disease of native coronary artery without angina pectoris: Secondary | ICD-10-CM | POA: Diagnosis not present

## 2023-10-16 DIAGNOSIS — E66812 Obesity, class 2: Secondary | ICD-10-CM

## 2023-10-16 DIAGNOSIS — E119 Type 2 diabetes mellitus without complications: Secondary | ICD-10-CM

## 2023-10-16 DIAGNOSIS — I10 Essential (primary) hypertension: Secondary | ICD-10-CM

## 2023-10-16 DIAGNOSIS — I2089 Other forms of angina pectoris: Secondary | ICD-10-CM

## 2023-10-16 NOTE — Progress Notes (Signed)
 Cardiology Office Note:  .   Date:  10/16/2023  ID:  Jerry Hudson, DOB 23-Nov-1950, MRN [id number] PCP:  Lory Rough PA-C  Former Cardiology Providers: N/A [facility] Providers Cardiologist:  Olinda Bertrand, DO , [facility] (established care August 2021) Electrophysiologist:  None  Click to update primary MD,subspecialty MD or APP then REFRESH:1}    Chief Complaint  Patient presents with   Atrial Fibrillation   Follow-up    History of Present Illness: .   Jerry Hudson is a 73 y.o.  male whose past medical history and cardiovascular risk factors includes: Persistent atrial fibrillation, hypertension, history of prostate cancer status post robotic assisted laparoscopic prostatectomy with bilateral pelvic lymph node dissection repair, trigeminal neuralgia status post nerve decompression, history of varicose veins, obesity due to excess calories, advanced age.   Patient being followed by the practice for atrial fibrillation management.  Diagnosed in July 2021 when he was undergoing routine colonoscopy.  And since then he has been on rate control strategy as well as oral anticoagulation for thromboembolic prophylaxis.  He did undergo cardioversion x 1 but had early recurrence of A-fib and since then he is been managed medically.  In the past patient has refused to be on antiarrhythmic medications or be seen by EP for possible ablation candidacy as he is clinically asymptomatic with his A-fib.  When I last saw him in the office patient was endorsing precordial pain concerning for possible unstable angina.  Patient was consented for left heart catheterization with possible intervention.  His heart catheterization did not illustrate obstructive disease with findings to suggest possible microvascular angina.  When he followed up with APP he was no longer having anginal chest pain.  He presents today for follow-up.  Since last office visit patient denies any anginal chest pain or heart  failure symptoms.  No hospitalizations or urgent care visits for cardiovascular reasons.  He has been compliant with his medical therapy.  No significant weight change.  Physical endurance remains stable ( no structured exercise program but keeps active). Home SBP ranges between <130 mmHg.  Review of Systems: .   Review of Systems  Cardiovascular:  Negative for chest pain, claudication, irregular heartbeat, leg swelling, near-syncope, orthopnea, palpitations, paroxysmal nocturnal dyspnea and syncope.  Respiratory:  Negative for shortness of breath.   Hematologic/Lymphatic: Negative for bleeding problem.    Studies Reviewed:   EKG: EKG Interpretation Date/Time:  Wednesday October 16 2023 08:43:53 EDT Text Interpretation: Atrial fibrillation with premature ventricular or aberrantly conducted complexes Cannot rule out Inferior infarct , age undetermined Cannot rule out Anterior infarct , age undetermined When compared with ECG of 29-May-2023 16:10, No significant change was found Confirmed by Olinda Bertrand [phone]) on 10/16/2023 9:09:28 AM  Echocardiogram: 08/07/2021:  Normal LV systolic function with visual EF 55-60%. Left ventricle cavity is normal in size. Mild left ventricular hypertrophy. Normal global wall motion. Normal LAP. Unable to evaluate diastolic function due to atrial fibrillation.  Native trileaflet aortic valve.   Trace aortic regurgitation. Aortic valve sclerosis without stenosis.  Mild tricuspid regurgitation. No evidence of pulmonary hypertension.  Mild pulmonic regurgitation.  Compared to study 01/07/2020 no significant change.  Lexiscan  (with Mod Bruce protocol) Nuclear stress test 02/13/2022: Myocardial perfusion is abnormal. There is a small sized reversible mild defect in the septal and apical regions.  Overall LV systolic function is normal without regional wall motion abnormalities. Stress LV EF: 57%.  Nondiagnostic ECG stress. The heart rate response was consistent with  Regadenoson .  No previous exam available for comparison. Low risk.    CT CCS 02/21/2022: FINDINGS: CORONARY CALCIUM  SCORES: Left Main: 131 LAD: 186 LCx: 23 RCA: 107   Total Agatston Score: 447 MESA database percentile: 73   Left Heart Catheterization 06/03/23: Hemodynamic data: LVEDP 13 mmHg.  No pressure gradient across the aortic valve.   Angiographic data: LM: Large-caliber vessel.  Normal. LAD: Large-caliber vessel giving origin to moderate-sized diagonal 1 and 2, there is mild scattered disease throughout the proximal and mid LAD and there is also mild to moderate calcification in the proximal to mid LAD.  In the midsegment there is a 40% focal stenosis, previously evaluated (FFR -2023). LCx: Large vessel, gives origin to large OM1 and 2.  No significant disease. RI: Large-caliber vessel.  Smooth and normal. RCA: Very large caliber vessel, dominant vessel.  Slow flow is evident in the coronary artery, improved with IC nitroglycerin .      Impression and recommendations: His presentation is most consistent with microvascular angina.  I have added amlodipine  5 mg daily.  Consider Ranexa if symptoms are still bothersome   Direct-current cardioversion: 05/24/2020: 200 J of synchronized x1 converted to normal sinus rhythm.  RADIOLOGY: N/A  Risk Assessment/Calculations:   Click Here to Calculate/Change CHADS2VASc Score The patient's CHADS2-VASc score is 4, indicating a 4.8% annual risk of stroke.   CHF History: No HTN History: Yes Diabetes History: Yes Stroke History: No Vascular Disease History: Yes  Labs:    External Labs: Collected: April 2024: Total cholesterol 128, triglycerides 108, HDL 48, LDL 61, non-HDL 80  Collected: September 2024 Care Everywhere Potassium 3.7. eGFR 73. BUN 18, creatinine 1.09 Hemoglobin A1c 6.5             Latest Ref Rng & Units 05/29/2023    4:56 PM 05/18/2020   10:19 AM 05/14/2018    3:03 PM  BMP  Glucose 70 - 99 mg/dL 161   096  91   BUN 8 - 27 mg/dL 20  18  15    Creatinine 0.76 - 1.27 mg/dL 0.45  4.09  8.11   BUN/Creat Ratio 10 - 24 21  18  16    Sodium 134 - 144 mmol/L 143  141  142   Potassium 3.5 - 5.2 mmol/L 4.0  4.0  4.3   Chloride 96 - 106 mmol/L 103  106  102   CO2 20 - 29 mmol/L 25  21  24    Calcium  8.6 - 10.2 mg/dL 9.7  9.4  9.8      Physical Exam:    Today's Vitals   10/16/23 0841  BP: 116/76  Pulse: 85  Resp: 16  SpO2: 97%  Weight: 264 lb 3.2 oz (119.8 kg)  Height: 6' (1.829 m)   Body mass index is 35.83 kg/m. Wt Readings from Last 3 Encounters:  10/16/23 264 lb 3.2 oz (119.8 kg)  06/18/23 263 lb 3.2 oz (119.4 kg)  06/03/23 260 lb (117.9 kg)    Physical Exam  Constitutional: No distress.  Age appropriate, hemodynamically stable.   Neck: No JVD present.  Cardiovascular: Normal rate, S1 normal, S2 normal, intact distal pulses and normal pulses. An irregularly irregular rhythm present. Exam reveals no gallop, no S3 and no S4.  Murmur heard. Holosystolic murmur is present with a grade of 3/6 at the apex radiating to the axilla. Varicose veins bilaterally.  Pulmonary/Chest: Effort normal and breath sounds normal. No stridor. He has no wheezes. He has no rales.  Abdominal: Soft.  Bowel sounds are normal. He exhibits no distension. There is no abdominal tenderness.  Musculoskeletal:        General: No edema.     Cervical back: Neck supple.  Neurological: He is alert and oriented to person, place, and time. He has intact cranial nerves (2-12).  Skin: Skin is warm and moist.    Impression & Recommendation(s):  Impression:   ICD-10-CM   1. Coronary atherosclerosis due to calcified coronary lesion  I25.10 EKG 12-Lead   I25.84     2. Agatston CAC score, >400  R93.1     3. Microvascular angina (HCC)  I20.89     4. Persistent atrial fibrillation (HCC)  I48.19 Hemoglobin and hematocrit, blood    Basic metabolic panel with GFR    Basic metabolic panel with GFR    Hemoglobin and  hematocrit, blood    5. Long term (current) use of anticoagulants  Z79.01     6. Non-insulin dependent type 2 diabetes mellitus (HCC)  E11.9     7. Mixed hyperlipidemia  E78.2     8. Benign hypertension  I10     9. Class 2 severe obesity due to excess calories with serious comorbidity and body mass index (BMI) of 35.0 to 35.9 in adult [facility])  R60.454    E66.01    Z68.35        Recommendation(s):  Coronary atherosclerosis due to calcified coronary lesion Agatston CAC score, >400 Microvascular angina (HCC) Denies anginal chest pain or heart failure symptoms since last office encounter. No use of sublingual nitroglycerin  tablets since the last office visit. Currently not on aspirin  as he is on anticoagulation for atrial fibrillation management. Continue Crestor . Continue amlodipine  and Isordil  30 mg p.o. 3 times daily Reemphasized the importance of secondary prevention with focus on improving the modifiable cardiovascular risk factors such as glycemic control, lipid management, blood pressure control, weight loss.  Persistent atrial fibrillation (HCC) Long term (current) use of anticoagulants Likely permanent based on clinical trajectory. Has undergone cardioversion in the past but had ER AF Has chose not to proceed forward with rhythm management as he is asymptomatic while in A-fib. Patient understands that prolonged atrial fibrillation can lead to cardiovascular comorbidities. Rate control: N/A Rhythm control: N/A Thromboembolic prophylaxis: Xarelto Was on Toprol -XL in the past but discontinued secondary to symptomatic bradycardia, felt tired and fatigued  Non-insulin dependent type 2 diabetes mellitus (HCC) Reemphasized importance of glycemic control. Recently started on metformin by his PCP.  Mixed hyperlipidemia Labs from April 2025 independently reviewed and mentioned above for reference, available in Care Everywhere. Continue Crestor  40 mg p.o. nightly  Benign  hypertension Office blood pressures are well-controlled. Continue amlodipine , hydrochlorothiazide , olmesartan   Class 2 severe obesity due to excess calories with serious comorbidity and body mass index (BMI) of 35.0 to 35.9 in adult [facility]) Body mass index is 35.83 kg/m. I reviewed with him importance of diet, regular physical activity/exercise, weight loss.   Patient is educated on the importance of increasing physical activity gradually as tolerated with a goal of moderate intensity exercise for 30 minutes a day 5 days a week.   Final Medication List:    No orders of the defined types were placed in this encounter.   There are no discontinued medications.    Current Outpatient Medications:    allopurinol  (ZYLOPRIM ) 300 MG tablet, Take 300 mg by mouth in the morning., Disp: , Rfl: 0   amLODipine  (NORVASC ) 5 MG tablet, Take 1 tablet (5 mg total) by  mouth daily., Disp: 90 tablet, Rfl: 3   B Complex Vitamins (VITAMIN B COMPLEX) TABS, Take 1 tablet by mouth every evening., Disp: , Rfl:    hydrochlorothiazide  (HYDRODIURIL ) 25 MG tablet, Take 25 mg by mouth in the morning., Disp: , Rfl:    ibuprofen (ADVIL) 200 MG tablet, Take 400 mg by mouth every 8 (eight) hours as needed for moderate pain (pain score 4-6) or headache., Disp: , Rfl:    isosorbide  dinitrate (ISORDIL ) 30 MG tablet, Take 1 tablet (30 mg total) by mouth 3 (three) times daily., Disp: 180 tablet, Rfl: 3   metFORMIN (GLUCOPHAGE-XR) 500 MG 24 hr tablet, Take 1,000 mg by mouth daily with breakfast., Disp: , Rfl:    nitroGLYCERIN  (NITROSTAT ) 0.4 MG SL tablet, Place 1 tablet (0.4 mg total) under the tongue every 5 (five) minutes as needed for chest pain. Maximum of 3 tablets per 1 chest pain episode., Disp: 30 tablet, Rfl: 0   olmesartan  (BENICAR ) 40 MG tablet, Take 40 mg by mouth in the morning., Disp: , Rfl:    Oxcarbazepine  (TRILEPTAL ) 300 MG tablet, Take 300 mg by mouth 2 (two) times daily., Disp: , Rfl:    rivaroxaban (XARELTO) 20  MG TABS tablet, Take 20 mg by mouth daily with supper., Disp: , Rfl:    rosuvastatin  (CRESTOR ) 40 MG tablet, TAKE 1 TABLET(40 MG) BY MOUTH DAILY (Patient taking differently: Take 40 mg by mouth every evening.), Disp: 90 tablet, Rfl: 1   zinc gluconate 50 MG tablet, Take 50 mg by mouth in the morning., Disp: , Rfl:   Consent:    NA   Disposition:   53-month follow-up sooner if needed   Signed, Awilda Bogus, [facility]  A Division of [facility] [address], [place], [place] [address]  [place], [place] [address] 10/16/2023 11:19 AM

## 2023-10-16 NOTE — Patient Instructions (Signed)
 Medication Instructions:  No medication changes were made at this visit. Continue current regimen.   *If you need a refill on your cardiac medications before your next appointment, please call your pharmacy*  Lab Work: To be completed in 6 months: hemoglobin/hematocrit and BMP  If you have labs (blood work) drawn today and your tests are completely normal, you will receive your results only by: MyChart Message (if you have MyChart) OR A paper copy in the mail If you have any lab test that is abnormal or we need to change your treatment, we will call you to review the results.  Testing/Procedures: None ordered today.  Follow-Up: At [facility], you and your health needs are our priority.  As part of our continuing mission to provide you with exceptional heart care, our providers are all part of one team.  This team includes your primary Cardiologist (physician) and Advanced Practice Providers or APPs (Physician Assistants and Nurse Practitioners) who all work together to provide you with the care you need, when you need it.  Your next appointment:   6 month(s)  Provider:   Olinda Bertrand, DO

## 2023-12-05 LAB — LAB REPORT - SCANNED
A1c: 6.3
EGFR: 86

## 2023-12-06 ENCOUNTER — Other Ambulatory Visit: Payer: Self-pay | Admitting: Cardiology

## 2023-12-06 DIAGNOSIS — I10 Essential (primary) hypertension: Secondary | ICD-10-CM

## 2023-12-24 ENCOUNTER — Ambulatory Visit: Payer: Self-pay | Admitting: Cardiology

## 2024-04-01 ENCOUNTER — Ambulatory Visit: Payer: Self-pay | Admitting: Cardiology

## 2024-04-01 LAB — BASIC METABOLIC PANEL WITH GFR
BUN/Creatinine Ratio: 13 (ref 10–24)
BUN: 13 mg/dL (ref 8–27)
CO2: 24 mmol/L (ref 20–29)
Calcium: 9.4 mg/dL (ref 8.6–10.2)
Chloride: 101 mmol/L (ref 96–106)
Creatinine, Ser: 1.02 mg/dL (ref 0.76–1.27)
Glucose: 111 mg/dL — AB (ref 70–99)
Potassium: 3.6 mmol/L (ref 3.5–5.2)
Sodium: 142 mmol/L (ref 134–144)
eGFR: 79 mL/min/1.73 (ref >59)

## 2024-04-01 LAB — HEMOGLOBIN AND HEMATOCRIT, BLOOD
Hematocrit: 38.2 % (ref 37.5–51.0)
Hemoglobin: 12.4 g/dL — ABNORMAL LOW (ref 13.0–17.7)

## 2024-04-09 NOTE — Telephone Encounter (Signed)
Left detailed message on voicemail (per DPR) 

## 2024-04-10 ENCOUNTER — Ambulatory Visit: Attending: Cardiology | Admitting: Cardiology

## 2024-04-10 ENCOUNTER — Telehealth: Payer: Self-pay

## 2024-04-10 ENCOUNTER — Encounter: Payer: Self-pay | Admitting: Cardiology

## 2024-04-10 VITALS — BP 144/76 | HR 71 | Ht 72.0 in | Wt 260.0 lb

## 2024-04-10 DIAGNOSIS — I4819 Other persistent atrial fibrillation: Secondary | ICD-10-CM

## 2024-04-10 DIAGNOSIS — Z7901 Long term (current) use of anticoagulants: Secondary | ICD-10-CM | POA: Diagnosis not present

## 2024-04-10 DIAGNOSIS — I2584 Coronary atherosclerosis due to calcified coronary lesion: Secondary | ICD-10-CM

## 2024-04-10 DIAGNOSIS — I10 Essential (primary) hypertension: Secondary | ICD-10-CM

## 2024-04-10 DIAGNOSIS — I2510 Atherosclerotic heart disease of native coronary artery without angina pectoris: Secondary | ICD-10-CM

## 2024-04-10 DIAGNOSIS — R931 Abnormal findings on diagnostic imaging of heart and coronary circulation: Secondary | ICD-10-CM | POA: Diagnosis not present

## 2024-04-10 DIAGNOSIS — E782 Mixed hyperlipidemia: Secondary | ICD-10-CM

## 2024-04-10 DIAGNOSIS — E119 Type 2 diabetes mellitus without complications: Secondary | ICD-10-CM

## 2024-04-10 DIAGNOSIS — I2089 Other forms of angina pectoris: Secondary | ICD-10-CM

## 2024-04-10 NOTE — Telephone Encounter (Signed)
 Spoke with Amy at [facility]. Relayed Dr. Tyree request to coordinate care for patient anemia. Amy took notes to pass to patients PCP.

## 2024-04-10 NOTE — Patient Instructions (Signed)
 Medication Instructions:  Your physician recommends that you continue on your current medications as directed. Please refer to the Current Medication list given to you today.  *If you need a refill on your cardiac medications before your next appointment, please call your pharmacy*  Lab Work: Hemoglobin and Hematocrit, BMET on 04/20/2024  Hemoglobin and Hematocrit, BMET in 6 months, prior to follow up. If you have labs (blood work) drawn today and your tests are completely normal, you will receive your results only by: MyChart Message (if you have MyChart) OR A paper copy in the mail If you have any lab test that is abnormal or we need to change your treatment, we will call you to review the results.  Testing/Procedures: None ordered  Follow-Up: At [facility], you and your health needs are our priority.  As part of our continuing mission to provide you with exceptional heart care, our providers are all part of one team.  This team includes your primary Cardiologist (physician) and Advanced Practice Providers or APPs (Physician Assistants and Nurse Practitioners) who all work together to provide you with the care you need, when you need it.  Your next appointment:   6 month(s)  Provider:   Madonna Large, DO    We recommend signing up for the patient portal called MyChart.  Sign up information is provided on this After Visit Summary.  MyChart is used to connect with patients for Virtual Visits (Telemedicine).  Patients are able to view lab/test results, encounter notes, upcoming appointments, etc.  Non-urgent messages can be sent to your provider as well.   To learn more about what you can do with MyChart, go to [email or web address].   Other Instructions We are sending a message to your PCP requesting they do an anemia workup. Please follow up with them for this testing.

## 2024-04-10 NOTE — Telephone Encounter (Signed)
-----   Message from [facility] sent at 04/10/2024 10:59 AM EST ----- Regarding: Reach ou to PCP Could you please reach out to his PCP and help coordinate his care.  Currently on Xarelto for thromboembolic prophylaxis given his atrial fibrillation.  Hemoglobin usually stays between 14-15 g/dL and recent it was 87.5h/iO.   He does have black stools but could be secondary to iron supplementation plus or minus GI losses?  Could you have the primary care reach out to him to further evaluate his anemia?  Dr. Tolia

## 2024-04-10 NOTE — Progress Notes (Signed)
 Cardiology Office Note:  .   Date:  04/10/2024  ID:  Jerry Hudson, DOB 08-27-1950, MRN [id number] PCP:  Debrah Josette MOHR PA-C  Former Cardiology Providers: N/A [facility] Providers Cardiologist:  Madonna Large, DO , [facility] (established care August 2021) Electrophysiologist:  None  Click to update primary MD,subspecialty MD or APP then REFRESH:1}    Chief Complaint  Patient presents with   Follow-up    Atrial fibrillation    History of Present Illness: .   Jerry Hudson is a 73 y.o.  male whose past medical history and cardiovascular risk factors includes: Persistent atrial fibrillation, hypertension, history of prostate cancer status post robotic assisted laparoscopic prostatectomy with bilateral pelvic lymph node dissection repair, trigeminal neuralgia status post nerve decompression, history of varicose veins, obesity due to excess calories, advanced age.   Patient being followed by the practice for atrial fibrillation management.  Diagnosed in July 2021 when he was undergoing routine colonoscopy.  And since then he has been on rate control strategy and oral anticoagulation for thromboembolic prophylaxis.  He did undergo cardioversion x 1 but had early recurrence of A-fib. In the past patient has refused to be on antiarrhythmic medications or be seen by EP for possible ablation candidacy as he is clinically asymptomatic with his A-fib.  In the past he had also endorsed chest pain concerning for possible unstable angina.  He had a left heart catheterization which did not illustrate obstructive disease with findings to suggest possible microvascular angina.  During prior follow up visits he has no longer expressed having chest pain.   Since last office visit Mr. Jerry Hudson denies any anginal chest pain or heart failure symptoms.   No hospitalizations or urgent care visits for cardiovascular reasons.   He has been compliant with his medical therapy.   Physical endurance  remains stable.  Home SBP are better controlled per patient. Has had black stools after starting iron supplements, he was told that his iron was low.  Otherwise no blood in urine, stools, or other sources of blood loss.   Review of Systems: .   Review of Systems  Cardiovascular:  Negative for chest pain, claudication, irregular heartbeat, leg swelling, near-syncope, orthopnea, palpitations, paroxysmal nocturnal dyspnea and syncope.  Respiratory:  Negative for shortness of breath.   Hematologic/Lymphatic: Negative for bleeding problem.  Gastrointestinal:  Positive for melena (Iron supplementation/GI losses?).    Studies Reviewed:    Echocardiogram: 08/07/2021:  Normal LV systolic function with visual EF 55-60%. Left ventricle cavity is normal in size. Mild left ventricular hypertrophy. Normal global wall motion. Normal LAP. Unable to evaluate diastolic function due to atrial fibrillation.  Native trileaflet aortic valve.   Trace aortic regurgitation. Aortic valve sclerosis without stenosis.  Mild tricuspid regurgitation. No evidence of pulmonary hypertension.  Mild pulmonic regurgitation.  Compared to study 01/07/2020 no significant change.  Lexiscan  (with Mod Bruce protocol) Nuclear stress test 02/13/2022: Myocardial perfusion is abnormal. There is a small sized reversible mild defect in the septal and apical regions.  Overall LV systolic function is normal without regional wall motion abnormalities. Stress LV EF: 57%.  No previous exam available for comparison. Low risk.    CT CCS 02/21/2022: FINDINGS: CORONARY CALCIUM  SCORES: Left Main: 131 LAD: 186 LCx: 23 RCA: 107   Total Agatston Score: 447 MESA database percentile: 73   Left Heart Catheterization 06/03/23: Hemodynamic data: LVEDP 13 mmHg.  No pressure gradient across the aortic valve.   Angiographic data: LM: Large-caliber  vessel.  Normal. LAD: Large-caliber vessel giving origin to moderate-sized diagonal 1 and 2,  there is mild scattered disease throughout the proximal and mid LAD and there is also mild to moderate calcification in the proximal to mid LAD.  In the midsegment there is a 40% focal stenosis, previously evaluated (FFR -2023). LCx: Large vessel, gives origin to large OM1 and 2.  No significant disease. RI: Large-caliber vessel.  Smooth and normal. RCA: Very large caliber vessel, dominant vessel.  Slow flow is evident in the coronary artery, improved with IC nitroglycerin .      Impression and recommendations: His presentation is most consistent with microvascular angina.  I have added amlodipine  5 mg daily.  Consider Ranexa if symptoms are still bothersome   Direct-current cardioversion: 05/24/2020: 200 J of synchronized x1 converted to normal sinus rhythm.  RADIOLOGY: N/A  Risk Assessment/Calculations:   Click Here to Calculate/Change CHADS2VASc Score The patient's CHADS2-VASc score is 4, indicating a 4.8% annual risk of stroke.   CHF History: No HTN History: Yes Diabetes History: Yes Stroke History: No Vascular Disease History: Yes  Labs:    External Labs: Collected: April 2024: Total cholesterol 128, triglycerides 108, HDL 48, LDL 61, non-HDL 80  Collected: September 2024 Care Everywhere Potassium 3.7. eGFR 73. BUN 18, creatinine 1.09 Hemoglobin A1c 6.5             Latest Ref Rng & Units 03/31/2024    1:13 PM 05/29/2023    4:56 PM 05/18/2020   10:19 AM  BMP  Glucose 70 - 99 mg/dL 888  886  881   BUN 8 - 27 mg/dL 13  20  18    Creatinine 0.76 - 1.27 mg/dL 8.97  9.02  8.98   BUN/Creat Ratio 10 - 24 13  21  18    Sodium 134 - 144 mmol/L 142  143  141   Potassium 3.5 - 5.2 mmol/L 3.6  4.0  4.0   Chloride 96 - 106 mmol/L 101  103  106   CO2 20 - 29 mmol/L 24  25  21    Calcium  8.6 - 10.2 mg/dL 9.4  9.7  9.4    Lab Results  Component Value Date   HGB 12.4 (L) 03/31/2024   HGB 14.9 05/29/2023   HGB 14.1 05/18/2020   HGB 11.8 (L) 04/21/2015   HGB 12.9 (L)  04/20/2015   HGB 14.2 04/15/2015   HGB 12.6 (L) 01/28/2011   HGB 14.6 01/27/2011   HGB 13.8 01/27/2011   Physical Exam:    Today's Vitals   04/10/24 0818  BP: (!) 144/76  Pulse: 71  SpO2: 97%  Weight: 260 lb (117.9 kg)  Height: 6' (1.829 m)    Body mass index is 35.26 kg/m. Wt Readings from Last 3 Encounters:  04/10/24 260 lb (117.9 kg)  10/16/23 264 lb 3.2 oz (119.8 kg)  06/18/23 263 lb 3.2 oz (119.4 kg)    Physical Exam  Constitutional: No distress.  Age appropriate, hemodynamically stable.   Neck: No JVD present.  Cardiovascular: Normal rate, S1 normal, S2 normal, intact distal pulses and normal pulses. An irregularly irregular rhythm present. Exam reveals no gallop, no S3 and no S4.  Murmur heard. Holosystolic murmur is present with a grade of 3/6 at the apex radiating to the axilla. Varicose veins bilaterally.  Pulmonary/Chest: Effort normal and breath sounds normal. No stridor. He has no wheezes. He has no rales.  Abdominal: Soft. Bowel sounds are normal. He exhibits no distension. There is no  abdominal tenderness.  Musculoskeletal:        General: No edema.     Cervical back: Neck supple.  Neurological: He is alert and oriented to person, place, and time. He has intact cranial nerves (2-12).  Skin: Skin is warm and moist.    Impression & Recommendation(s):  Impression:   ICD-10-CM   1. Coronary atherosclerosis due to calcified coronary lesion  I25.10 Hemoglobin and hematocrit, blood   I25.84 Hemoglobin and hematocrit, blood    Basic Metabolic Panel (BMET)    Basic Metabolic Panel (BMET)    Basic Metabolic Panel (BMET)    Hemoglobin and hematocrit, blood    2. Agatston CAC score, >400  R93.1     3. Microvascular angina  I20.89     4. Persistent atrial fibrillation (HCC)  I48.19     5. Long term (current) use of anticoagulants  Z79.01 Hemoglobin and hematocrit, blood    Hemoglobin and hematocrit, blood    Basic Metabolic Panel (BMET)    Basic Metabolic  Panel (BMET)    Basic Metabolic Panel (BMET)    Hemoglobin and hematocrit, blood    6. Non-insulin dependent type 2 diabetes mellitus (HCC)  E11.9     7. Mixed hyperlipidemia  E78.2     8. Benign hypertension  I10         Recommendation(s):  Coronary atherosclerosis due to calcified coronary lesion Agatston CAC score, >400 Microvascular angina (HCC) Denies anginal chest pain or heart failure symptoms since last office encounter. No use of sublingual nitroglycerin  tablets since the last office visit. Currently not on aspirin  as he is on anticoagulation for atrial fibrillation management. Continue Crestor . Continue amlodipine  and Isordil  30 mg p.o. 3 times daily Reemphasized the importance of secondary prevention with focus on improving the modifiable cardiovascular risk factors such as glycemic control, lipid management, blood pressure control, weight loss.  Persistent atrial fibrillation (HCC) Long term (current) use of anticoagulants Likely permanent based on clinical trajectory. Has undergone cardioversion in the past but had ER AF Has chose not to proceed forward with rhythm management as he is asymptomatic while in A-fib. Patient understands that prolonged atrial fibrillation can lead to cardiovascular comorbidities. Rate control: N/A Rhythm control: N/A Thromboembolic prophylaxis: Xarelto Was on Toprol -XL in the past but discontinued secondary to symptomatic bradycardia, felt tired and fatigued Patient has been getting follow-up labs to monitor hemoglobin and renal function.  Hemoglobin is trending downward.  His baseline hemoglobin is usually 14-15 g/dL.  Recently it has been closer to 12-1/2 g/dL.  Patient states that he did start iron supplementation and thereafter has had black stools.  Unsure if it is all secondary to iron supplements versus GI losses?. he has not followed up with PCP regarding this matter. I have asked him to follow-up with his primary care provider for  anemia workup -will send a message to the office. Will check his hemoglobin and hematocrit and BMP in January 2026 for close follow-up If he notices other sources of bleeds patient is advised to stop Xarelto due to risk/benefit ratio. Will also check his H&H and BMP prior to his next office visit in 6 months  Non-insulin dependent type 2 diabetes mellitus (HCC) Reemphasized importance of glycemic control. Recently started on metformin by his PCP.  Mixed hyperlipidemia Continue Crestor  40 mg p.o. nightly  Benign hypertension Office blood pressures are well-controlled. Continue amlodipine , hydrochlorothiazide , olmesartan   Class 2 severe obesity due to excess calories with serious comorbidity and body mass index (BMI) of  35.0 to 35.9 in adult [facility]) Body mass index is 35.26 kg/m. I reviewed with him importance of diet, regular physical activity/exercise, weight loss.   Patient is educated on the importance of increasing physical activity gradually as tolerated with a goal of moderate intensity exercise for 30 minutes a day 5 days a week.   Final Medication List:    No orders of the defined types were placed in this encounter.   There are no discontinued medications.    Current Outpatient Medications:    allopurinol  (ZYLOPRIM ) 300 MG tablet, Take 300 mg by mouth in the morning., Disp: , Rfl: 0   amLODipine  (NORVASC ) 5 MG tablet, Take 1 tablet (5 mg total) by mouth daily., Disp: 90 tablet, Rfl: 3   B Complex Vitamins (VITAMIN B COMPLEX) TABS, Take 1 tablet by mouth every evening., Disp: , Rfl:    hydrochlorothiazide  (HYDRODIURIL ) 25 MG tablet, Take 25 mg by mouth in the morning., Disp: , Rfl:    ibuprofen (ADVIL) 200 MG tablet, Take 400 mg by mouth every 8 (eight) hours as needed for moderate pain (pain score 4-6) or headache., Disp: , Rfl:    isosorbide  dinitrate (ISORDIL ) 30 MG tablet, TAKE 1 TABLET(30 MG) BY MOUTH THREE TIMES DAILY, Disp: 270 tablet, Rfl: 3   metFORMIN  (GLUCOPHAGE-XR) 500 MG 24 hr tablet, Take 1,000 mg by mouth daily with breakfast., Disp: , Rfl:    nitroGLYCERIN  (NITROSTAT ) 0.4 MG SL tablet, Place 1 tablet (0.4 mg total) under the tongue every 5 (five) minutes as needed for chest pain. Maximum of 3 tablets per 1 chest pain episode., Disp: 30 tablet, Rfl: 0   olmesartan  (BENICAR ) 40 MG tablet, Take 40 mg by mouth in the morning., Disp: , Rfl:    Oxcarbazepine  (TRILEPTAL ) 300 MG tablet, Take 300 mg by mouth 2 (two) times daily., Disp: , Rfl:    rivaroxaban (XARELTO) 20 MG TABS tablet, Take 20 mg by mouth daily with supper., Disp: , Rfl:    rosuvastatin  (CRESTOR ) 40 MG tablet, TAKE 1 TABLET(40 MG) BY MOUTH DAILY (Patient taking differently: Take 40 mg by mouth every evening.), Disp: 90 tablet, Rfl: 1   traZODone (DESYREL) 50 MG tablet, 1/2 to whole pill at bedtime, Disp: , Rfl:    zinc gluconate 50 MG tablet, Take 50 mg by mouth in the morning., Disp: , Rfl:   Consent:    NA   Disposition:   87-month follow-up sooner if needed  Signed, Madonna Michele HAS, [facility]  A Division of  [facility] [address], [place], [place] [address]  04/10/2024 10:51 AM

## 2024-05-21 ENCOUNTER — Telehealth (HOSPITAL_BASED_OUTPATIENT_CLINIC_OR_DEPARTMENT_OTHER): Payer: Self-pay

## 2024-05-21 NOTE — Telephone Encounter (Signed)
"  ° °  Pre-operative Risk Assessment    Patient Name: Jerry Hudson  DOB: 1950/08/22 MRN: [id number]   Date of last office visit: 04/10/24 with Michele Date of next office visit: NA  Request for Surgical Clearance    Procedure:  EGD and colonoscopy  Date of Surgery:  Clearance 06/12/24                                 Surgeon:  Dr. Kristie Socks Group or Practice Name:  [facility] Phone number:  [phone] Fax number:  [phone]   Type of Clearance Requested:   - Medical  - Pharmacy:  Hold Rivaroxaban (Xarelto) not indicated   Type of Anesthesia:  propofol    Additional requests/questions:    Bonney Augustin JONETTA Delores   05/21/2024, 10:40 AM   "

## 2024-05-22 LAB — BASIC METABOLIC PANEL WITH GFR
BUN/Creatinine Ratio: 22 (ref 10–24)
BUN: 20 mg/dL (ref 8–27)
CO2: 25 mmol/L (ref 20–29)
Calcium: 9.7 mg/dL (ref 8.6–10.2)
Chloride: 99 mmol/L (ref 96–106)
Creatinine, Ser: 0.92 mg/dL (ref 0.76–1.27)
Glucose: 108 mg/dL — AB (ref 70–99)
Potassium: 4 mmol/L (ref 3.5–5.2)
Sodium: 142 mmol/L (ref 134–144)
eGFR: 89 mL/min/1.73

## 2024-05-22 LAB — HEMOGLOBIN AND HEMATOCRIT, BLOOD
Hematocrit: 43.8 % (ref 37.5–51.0)
Hemoglobin: 14.1 g/dL (ref 13.0–17.7)

## 2024-05-24 ENCOUNTER — Ambulatory Visit: Payer: Self-pay | Admitting: Cardiology

## 2024-05-27 DIAGNOSIS — D509 Iron deficiency anemia, unspecified: Secondary | ICD-10-CM | POA: Insufficient documentation

## 2024-05-27 DIAGNOSIS — K429 Umbilical hernia without obstruction or gangrene: Secondary | ICD-10-CM | POA: Insufficient documentation

## 2024-05-27 DIAGNOSIS — E782 Mixed hyperlipidemia: Secondary | ICD-10-CM | POA: Insufficient documentation

## 2024-05-27 NOTE — Telephone Encounter (Signed)
 Patient with diagnosis of A Fib on Xarelto for anticoagulation.    Procedure: EGD and colonoscopy  Date of procedure: 06/12/24   CHA2DS2-VASc Score = 4  This indicates a 4.8% annual risk of stroke. The patient's score is based upon: CHF History: 0 HTN History: 1 Diabetes History: 1 Stroke History: 0 Vascular Disease History: 1 Age Score: 1 Gender Score: 0    CrCl 98 ml/min using adj body weight Platelet count 192k  Patient has not had an Afib/aflutter ablation in the last 3 months, DCCV within the last 4 weeks or a watchman implanted in the last 45 days    Per office protocol, patient can hold Xarelto for 2 days prior to procedure.    **This guidance is not considered finalized until pre-operative APP has relayed final recommendations.**

## 2024-05-27 NOTE — Telephone Encounter (Addendum)
 Dr. Michele,   You saw this patient on 04/10/2024. Per office protocol, will you please comment on medical clearance for EGD and colonoscopy?  Please route your response to P CV DIV Preop. I will communicate with requesting office once you have given recommendations.   Thank you!  Mardy Pizza, NP

## 2024-05-29 ENCOUNTER — Emergency Department (HOSPITAL_BASED_OUTPATIENT_CLINIC_OR_DEPARTMENT_OTHER): Admitting: Radiology

## 2024-05-29 ENCOUNTER — Encounter (HOSPITAL_BASED_OUTPATIENT_CLINIC_OR_DEPARTMENT_OTHER): Payer: Self-pay | Admitting: Emergency Medicine

## 2024-05-29 ENCOUNTER — Emergency Department (HOSPITAL_BASED_OUTPATIENT_CLINIC_OR_DEPARTMENT_OTHER)
Admission: EM | Admit: 2024-05-29 | Discharge: 2024-05-29 | Disposition: A | Discharge status: Home / Self Care | Attending: Emergency Medicine | Admitting: Emergency Medicine

## 2024-05-29 ENCOUNTER — Other Ambulatory Visit: Payer: Self-pay

## 2024-05-29 ENCOUNTER — Emergency Department (HOSPITAL_BASED_OUTPATIENT_CLINIC_OR_DEPARTMENT_OTHER)

## 2024-05-29 DIAGNOSIS — S8002XA Contusion of left knee, initial encounter: Secondary | ICD-10-CM | POA: Diagnosis not present

## 2024-05-29 DIAGNOSIS — R0789 Other chest pain: Secondary | ICD-10-CM | POA: Diagnosis not present

## 2024-05-29 DIAGNOSIS — W109XXA Fall (on) (from) unspecified stairs and steps, initial encounter: Secondary | ICD-10-CM | POA: Insufficient documentation

## 2024-05-29 DIAGNOSIS — I4891 Unspecified atrial fibrillation: Secondary | ICD-10-CM | POA: Insufficient documentation

## 2024-05-29 DIAGNOSIS — M542 Cervicalgia: Secondary | ICD-10-CM | POA: Insufficient documentation

## 2024-05-29 DIAGNOSIS — S0990XA Unspecified injury of head, initial encounter: Secondary | ICD-10-CM | POA: Insufficient documentation

## 2024-05-29 DIAGNOSIS — Z7901 Long term (current) use of anticoagulants: Secondary | ICD-10-CM | POA: Insufficient documentation

## 2024-05-29 DIAGNOSIS — S8992XA Unspecified injury of left lower leg, initial encounter: Secondary | ICD-10-CM | POA: Diagnosis present

## 2024-05-29 DIAGNOSIS — W19XXXA Unspecified fall, initial encounter: Secondary | ICD-10-CM

## 2024-05-29 NOTE — Telephone Encounter (Signed)
 Left detailed message per DPR. Relayed Dr. Tyree note.

## 2024-05-29 NOTE — ED Triage Notes (Addendum)
 Pt caox4 ambulatory reporting last night at approx 2000 he was on the last step of his stairs when he mis-stepped while carrying a bin and fell forward onto the bin hitting his chin on the bin. Also c/o pain in L knee, R elbow, R lateral neck and central chest. Denies LOC. Takes xarelto. Denies weakness/numbness in extremities.

## 2024-05-29 NOTE — Discharge Instructions (Signed)
 It looks like you likely bruised your knee.  There is some mild swelling with it.  Follow-up with orthopedic surgery.  Return for worsening symptoms.

## 2024-05-29 NOTE — ED Notes (Signed)
 Pt d/c instructions, medications, and follow-up care reviewed with pt. Pt verbalized understanding and had no further questions at time of d/c. Pt CA&Ox4, ambulatory, and in NAD at time of d/c. Pt discharged with family.

## 2024-05-29 NOTE — Telephone Encounter (Signed)
-----   Message from Nurse Roxie PARAS, RN sent at 05/29/2024  3:45 PM EST -----  ----- Message ----- From: Michele Richardson, DO Sent: 05/24/2024  11:56 PM EST To: [facility] Triage  My Rinke,   I have messaged Mr.Draiden L Nourse of his recent labs results.  Dr. Tolia

## 2024-05-29 NOTE — ED Provider Notes (Signed)
 " [place] EMERGENCY DEPARTMENT AT [facility] Provider Note   CSN: [id number] Arrival date & time: 05/29/24  9193     Patient presents with: [place]   Jerry Hudson is a 74 y.o. male.    Fall  Patient presents after a fall.  Last night around 8:00 was walking down the stairs with a box.  Missed stepped and fell down the last stair.  Landed onto the bin with his chin.  Pain in left knee jaw and anterior chest.  No loss conscious.  However is on Xarelto for atrial fibrillation.     Prior to Admission medications  Medication Sig Start Date End Date Taking? Authorizing Provider  allopurinol  (ZYLOPRIM ) 300 MG tablet Take 300 mg by mouth in the morning. 03/22/15   [provider]  amLODipine  (NORVASC ) 5 MG tablet Take 1 tablet (5 mg total) by mouth daily. 07/01/23   Wyn Jackee VEAR Mickey., NP  B Complex Vitamins (VITAMIN B COMPLEX) TABS Take 1 tablet by mouth every evening.    [provider]  hydrochlorothiazide  (HYDRODIURIL ) 25 MG tablet Take 25 mg by mouth in the morning. 12/04/19   [provider]  ibuprofen (ADVIL) 200 MG tablet Take 400 mg by mouth every 8 (eight) hours as needed for moderate pain (pain score 4-6) or headache.    [provider]  isosorbide  dinitrate (ISORDIL ) 30 MG tablet TAKE 1 TABLET(30 MG) BY MOUTH THREE TIMES DAILY 12/09/23   Tolia, Sunit, DO  metFORMIN (GLUCOPHAGE-XR) 500 MG 24 hr tablet Take 1,000 mg by mouth daily with breakfast.    [provider]  nitroGLYCERIN  (NITROSTAT ) 0.4 MG SL tablet Place 1 tablet (0.4 mg total) under the tongue every 5 (five) minutes as needed for chest pain. Maximum of 3 tablets per 1 chest pain episode. 05/29/23   Tolia, Sunit, DO  olmesartan  (BENICAR ) 40 MG tablet Take 40 mg by mouth in the morning. 12/04/19   [provider]  Oxcarbazepine  (TRILEPTAL ) 300 MG tablet Take 300 mg by mouth 2 (two) times daily. 05/09/20   [provider]  rivaroxaban (XARELTO) 20 MG TABS  tablet Take 20 mg by mouth daily with supper. 12/04/19 05/30/26  [provider]  rosuvastatin  (CRESTOR ) 40 MG tablet TAKE 1 TABLET(40 MG) BY MOUTH DAILY Patient taking differently: Take 40 mg by mouth every evening. 11/16/22   Michele, Sunit, DO  traZODone (DESYREL) 50 MG tablet 1/2 to whole pill at bedtime 03/16/24   [provider]  zinc gluconate 50 MG tablet Take 50 mg by mouth in the morning.    [provider]    Allergies: Patient has no known allergies.    Review of Systems  Updated Vital Signs BP (!) 145/94 (BP Location: Left Arm)   Pulse 65   Temp 98 F (36.7 C) (Oral)   Resp 14   Ht 6' (1.829 m)   Wt 114.3 kg   SpO2 98%   BMI 34.18 kg/m   Physical Exam Vitals and nursing note reviewed.  HENT:     Head: Atraumatic.     Mouth/Throat:     Comments: Some anterior tenderness to mandible but appears stable.  Teeth appear intact. Neck:     Comments: Does have some mild midline tenderness on the upper cervical spine. Cardiovascular:     Rate and Rhythm: Normal rate. Rhythm irregular.  Pulmonary:     Comments: Tenderness to anterior upper chest wall.  No crepitance.  No subcu emphysema. Chest:  Chest wall: Tenderness present.  Abdominal:     Tenderness: There is no abdominal tenderness.  Musculoskeletal:     Comments: Tenderness to the left knee anteriorly.  Dressings in place.  Decreased range of motion.  No large effusion.  No hip or ankle tenderness.  Neurological:     Mental Status: He is alert.     (all labs ordered are listed, but only abnormal results are displayed) Labs Reviewed - No data to display  EKG: None  Radiology: CT Cervical Spine Wo Contrast Result Date: 05/29/2024 EXAM: CT CERVICAL SPINE WITHOUT CONTRAST 05/29/2024 09:10:55 AM TECHNIQUE: CT of the cervical spine was performed without the administration of intravenous contrast. Multiplanar reformatted images are provided for review. Automated exposure control,  iterative reconstruction, and/or weight based adjustment of the mA/kV was utilized to reduce the radiation dose to as low as reasonably achievable. COMPARISON: CT head and face reported separately today. CLINICAL HISTORY: 74 year old male status post fall on stairs overnight, on blood thinners, with neck trauma. FINDINGS: BONES AND ALIGNMENT: No acute fracture or traumatic malalignment. DEGENERATIVE CHANGES: Evidence of developing degenerative interbody ankylosis at C7-T1. Chronic severe disc and endplate degeneration at the adjacent C6-C7 level. Other moderately advanced chronic cervical spine disc, endplate, and facet degeneration. Multilevel degenerative cervical spinal stenosis suspected. SOFT TISSUES: No prevertebral soft tissue swelling. Calcified cervical carotid atherosclerosis bilaterally. Otherwise negative visible non-contrast deep soft tissue spaces of the neck. Negative visible non-contrast thoracic inlet. IMPRESSION: 1. No acute traumatic injury identified in the cervical spine. 2. Advanced cervical spinal degeneration. Electronically signed by: Helayne Hurst MD 05/29/2024 09:23 AM EST RP Workstation: [id number]   CT Maxillofacial Wo Contrast Result Date: 05/29/2024 EXAM: CT OF THE FACE WITHOUT CONTRAST 05/29/2024 09:10:55 AM TECHNIQUE: CT of the face was performed without the administration of intravenous contrast. Multiplanar reformatted images are provided for review. Automated exposure control, iterative reconstruction, and/or weight based adjustment of the mA/kV was utilized to reduce the radiation dose to as low as reasonably achievable. COMPARISON: CT head and cervical spine reported separately today. CLINICAL HISTORY: 74 year old male status post fall on stairs overnight, on blood thinners, with blunt facial trauma. FINDINGS: FACIAL BONES: No acute facial fracture. No mandibular dislocation. No suspicious bone lesion. ORBITS: Globes are intact. No acute traumatic injury. No inflammatory  change. SINUSES AND MASTOIDS: Minor maxillary alveolar recess mucosal thickening. No sinus layering fluid or hemorrhage. Tympanic cavities and mastoids are clear. SOFT TISSUES: Broad based mild soft tissue swelling and stranding overlying the mandible mental protuberance in the midline (series 2 image 79). No soft tissue gas. No other discrete soft tissue injury. IMPRESSION: 1. Mild midline chin soft tissue swelling. 2. No acute facial fracture. Electronically signed by: Helayne Hurst MD 05/29/2024 09:20 AM EST RP Workstation: [id number]   CT Head Wo Contrast Result Date: 05/29/2024 EXAM: CT HEAD WITHOUT CONTRAST 05/29/2024 09:10:55 AM TECHNIQUE: CT of the head was performed without the administration of intravenous contrast. Automated exposure control, iterative reconstruction, and/or weight based adjustment of the mA/kV was utilized to reduce the radiation dose to as low as reasonably achievable. COMPARISON: CT face and cervical spine reported separately today. CLINICAL HISTORY: 74 year old male status post fall on stairs over night, on blood thinners. Head trauma, minor (Age >= 65 years). FINDINGS: BRAIN AND VENTRICLES: No acute hemorrhage. No evidence of acute infarct. No hydrocephalus. No extra-axial collection. No mass effect or midline shift. Cavum septum pellucidum, normal variant. Brain volume is within normal limits for age. Incidental appearing asymmetric  extra axial CSF in the right posterior fossa (series 2 image 9). No posterior fossa mass effect. Minimal to mild patchy periventricular white matter hypodensity with otherwise maintained gray white differentiation. No suspicious intracranial vascular hyperdensity. Advanced calcified atherosclerosis at the skull base. ORBITS: No acute abnormality. SINUSES: Paranasal sinuses are well aerated. SOFT TISSUES AND SKULL: No acute soft tissue abnormality. No skull fracture. Tympanic cavities and mastoids are well aerated. IMPRESSION: 1. No acute traumatic  injury identified. 2. Negative for age non-contrast CT appearance of the brain. Electronically signed by: Helayne Hurst MD 05/29/2024 09:16 AM EST RP Workstation: [id number]   DG Chest 2 View Result Date: 05/29/2024 EXAM: 2 VIEW(S) XRAY OF THE CHEST 05/29/2024 09:07:41 AM COMPARISON: 02/21/2022 CLINICAL HISTORY: Patient fell. FINDINGS: LUNGS AND PLEURA: No focal pulmonary opacity. No pleural effusion. No pneumothorax. HEART AND MEDIASTINUM: Aortic atherosclerosis. No acute abnormality of the cardiac and mediastinal silhouettes. BONES AND SOFT TISSUES: Anterior spine bridging osteophytes. IMPRESSION: 1. No acute cardiopulmonary abnormality. Electronically signed by: Rogelia Myers MD 05/29/2024 09:11 AM EST RP Workstation: [id number]   DG Knee Complete 4 Views Left Result Date: 05/29/2024 CLINICAL DATA:  Left knee pain after fall EXAM: LEFT KNEE - COMPLETE 4+ VIEW COMPARISON:  None Available. FINDINGS: No evidence of fracture, dislocation, or joint effusion. Minimal narrowing of medial joint space is noted. Chondrocalcinosis is noted laterally. Soft tissues are unremarkable. IMPRESSION: Minimal degenerative joint disease is noted medially. No acute abnormality seen. Electronically Signed   By: Lynwood Landy Raddle M.D.   On: 05/29/2024 09:09     Procedures   Medications Ordered in the ED - No data to display                                  Medical Decision Making Amount and/or Complexity of Data Reviewed Radiology: ordered.   Patient with fall.  Mechanical.  Did however hit jaw and extend his neck somewhat.  Also anterior chest pain.  Will get imaging of this and the head since he is on anticoagulation.  Also with left knee injury.  Reported laceration but dressing in place.  Will remove dressing.  Tetanus reportedly up-to-date.  Will get x-ray  Workup reassuring.  Only abrasion on knee.  Negative CT imaging.  X-ray does not show fracture of either ribs sternum or knee.  Does have some effusion on  the knee.  May need to follow-up with Ortho.  Potential hemarthrosis.  Has seen Beverley Economy in the past.  Will discharge home.     Final diagnoses:  Fall, initial encounter  Contusion of left knee, initial encounter    ED Discharge Orders     None          Patsey Lot, MD 05/29/24 1337  "

## 2024-06-05 NOTE — Telephone Encounter (Signed)
 Dr. Michele,  Please see question from Alyssa.  Would you be able to comment on cardiac risk prior to EGD/colonoscopy?  Procedure scheduled for 06/12/2024.  Thank you for your help.  Please direct her response to [facility] preop pool.  Josefa HERO. Janasia Coverdale NP-C     06/05/2024, 8:12 AM [facility] [address] [place], [place] [address] Office [phone]

## 2024-06-07 NOTE — Telephone Encounter (Signed)
 Patient is acceptable candidate for EGD/colonoscopy.  As noted above can hold Xarelto 2 days prior to the procedure and restart once cleared by his gastroenterologist.  Dr. Michele

## 2024-06-11 ENCOUNTER — Other Ambulatory Visit: Payer: Self-pay | Admitting: Cardiology

## 2024-06-11 MED ORDER — AMLODIPINE BESYLATE 5 MG PO TABS: 5.0000 mg | ORAL_TABLET | Freq: Every day | ORAL | 3 refills | Status: AC
